# Patient Record
Sex: Male | Born: 1965 | Race: White | Hispanic: No | Marital: Single | Smoking: Current every day smoker
Health system: Southern US, Community
[De-identification: ages and names within clinical notes are randomized; demographics above are authoritative.]

## PROBLEM LIST (undated history)

## (undated) DIAGNOSIS — J449 Chronic obstructive pulmonary disease, unspecified: Secondary | ICD-10-CM

## (undated) DIAGNOSIS — S069X9A Unspecified intracranial injury with loss of consciousness of unspecified duration, initial encounter: Secondary | ICD-10-CM

## (undated) DIAGNOSIS — S069XAA Unspecified intracranial injury with loss of consciousness status unknown, initial encounter: Secondary | ICD-10-CM

## (undated) HISTORY — PX: ANKLE SURGERY: SHX546

## (undated) HISTORY — PX: CHOLECYSTECTOMY: SHX55

## (undated) HISTORY — DX: Unspecified intracranial injury with loss of consciousness of unspecified duration, initial encounter: S06.9X9A

## (undated) HISTORY — PX: OTHER SURGICAL HISTORY: SHX169

## (undated) HISTORY — DX: Unspecified intracranial injury with loss of consciousness status unknown, initial encounter: S06.9XAA

---

## 2005-12-05 ENCOUNTER — Emergency Department: Payer: Self-pay | Admitting: Emergency Medicine

## 2009-03-28 ENCOUNTER — Emergency Department: Payer: Self-pay | Admitting: Emergency Medicine

## 2009-03-30 ENCOUNTER — Ambulatory Visit: Payer: Self-pay | Admitting: Internal Medicine

## 2009-04-06 ENCOUNTER — Ambulatory Visit: Payer: Self-pay | Admitting: Internal Medicine

## 2009-04-13 ENCOUNTER — Ambulatory Visit: Payer: Self-pay | Admitting: Internal Medicine

## 2009-04-20 ENCOUNTER — Ambulatory Visit: Payer: Self-pay | Admitting: Internal Medicine

## 2009-04-27 ENCOUNTER — Ambulatory Visit: Payer: Self-pay | Admitting: Internal Medicine

## 2009-05-04 ENCOUNTER — Ambulatory Visit: Payer: Self-pay | Admitting: Internal Medicine

## 2009-05-11 ENCOUNTER — Ambulatory Visit: Payer: Self-pay | Admitting: Internal Medicine

## 2009-06-23 ENCOUNTER — Ambulatory Visit: Payer: Self-pay | Admitting: Family Medicine

## 2012-05-20 ENCOUNTER — Ambulatory Visit: Payer: Self-pay | Admitting: Orthopedic Surgery

## 2012-05-20 LAB — CBC
HCT: 49.5 % (ref 40.0–52.0)
HGB: 17.1 g/dL (ref 13.0–18.0)
MCH: 34 pg (ref 26.0–34.0)
MCHC: 34.6 g/dL (ref 32.0–36.0)
RBC: 5.03 10*6/uL (ref 4.40–5.90)

## 2012-05-20 LAB — BASIC METABOLIC PANEL
BUN: 13 mg/dL (ref 7–18)
Chloride: 105 mmol/L (ref 98–107)
Creatinine: 0.91 mg/dL (ref 0.60–1.30)
EGFR (Non-African Amer.): >60
Glucose: 115 mg/dL — ABNORMAL HIGH (ref 65–99)
Osmolality: 275 (ref 275–301)
Potassium: 3.9 mmol/L (ref 3.5–5.1)
Sodium: 137 mmol/L (ref 136–145)

## 2012-05-24 ENCOUNTER — Ambulatory Visit: Payer: Self-pay | Admitting: Orthopedic Surgery

## 2013-01-09 ENCOUNTER — Ambulatory Visit: Payer: Self-pay | Admitting: Pain Medicine

## 2013-04-18 ENCOUNTER — Emergency Department: Payer: Self-pay | Admitting: Emergency Medicine

## 2013-04-23 ENCOUNTER — Ambulatory Visit: Payer: Self-pay | Admitting: Pain Medicine

## 2013-05-20 ENCOUNTER — Ambulatory Visit: Payer: Self-pay | Admitting: Pain Medicine

## 2013-06-30 ENCOUNTER — Ambulatory Visit: Payer: Self-pay | Admitting: Pain Medicine

## 2013-09-30 ENCOUNTER — Ambulatory Visit: Payer: Self-pay | Admitting: Orthopedic Surgery

## 2014-10-27 NOTE — Op Note (Signed)
PATIENT NAME:  Kirk Martin, Kirk Martin MR#:  [id number] DATE OF BIRTH:  July 29, 1965  DATE OF PROCEDURE:  05/24/2012  PREOPERATIVE DIAGNOSIS: Left bimalleolar ankle fracture.   POSTOPERATIVE DIAGNOSIS: Left bimalleolar ankle fracture.   PROCEDURE: Open reduction internal fixation bimalleolar ankle fracture.   SURGEON: Timoteo Gaul, MD  ANESTHESIA: Spinal with local injection with 0.25% Marcaine plain.   COMPLICATIONS: None.   SPECIMENS: None.   INDICATION FOR THE PROCEDURE: Patient is a 49 year old male who fell off a ladder. The injury occurred on 05/11/2012 at work. Patient was found with a fractured dislocation on presentation to [facility] in [place]. He was closed reduced there and then was sent to my office by his workers compensation carrier. Given the instability of this injury and the patient's high level of functioning it was recommended he undergo open reduction internal fixation.   The risks and benefits of surgery were explained to the patient in the office prior to the date of surgery. The risks include infection, bleeding, nerve or blood vessel injury especially injury to the superficial peroneal nerve leading to permanent dorsal foot numbness, ankle stiffness or arthritis, malunion, nonunion, persistent pain, painful hardware requiring removal and the need for further surgery. Medical complications include, but are not limited to, deep vein thrombosis and pulmonary embolism, myocardial infarction, pneumonia, stroke, respiratory failure and death. Patient understood these risks and wished to proceed.   PROCEDURE NOTE: Patient was met in the preoperative area. His mother was at the bedside. I signed the left leg with the word "yes" according to the hospital's right site protocol. The patient had a splint on the left leg in the preop holding area. I also verbally confirmed with the patient that this was the correct site of surgery as well as used my office notes and the patient's  radiographic studies for reference.   Patient was then brought to the Operating Room where he underwent a spinal anesthetic by the anesthesia service. He was then placed supine on the operative table. A bump was placed under his left hip to allow for some internal rotation of the lower leg during the surgery. Patient was prepped and draped in sterile fashion. All bony prominences were adequately padded.   A timeout was performed to verify the patient's name, date of birth, medical record number, correct site of surgery, and correct procedure to be performed. It was also used to verify patient had received antibiotics and that all appropriate instruments, implants, and radiographic studies were available in the room. Once all in attendance were in agreement the case began.   Mini FluoroScan imaging was used to plan for the surgical incisions. The surgical incisions were based medially and laterally over the fracture site. A #15 blade was used to make a longitudinal lateral incision based over the fibula. The soft tissues were carefully dissected using a Metzenbaum scissor and pick-up. Care was taken to avoid injury to the superficial peroneal nerve. Once the fracture site was identified the wound was copiously irrigated. Fracture hematoma was removed. The fracture was then manually reduced and held into position with two K wires. A 10 hole one-third tubular plate was then contoured to the fibula and held into position with a fracture clamp. FluoroScan images were taken to ensure adequate reduction of the fracture and placement of the hardware. Two proximal bicortical screws and two distal cancellous screws were then placed. Again, images were taken to ensure the fracture remained reduced and the hardware was in good position. The remaining  proximal bicortical screws and two distal locking screws were then placed. The fracture was spanned with the hardware. The fracture was a transverse comminuted pattern and  did not lend itself to placement of a lag screw. Once the fibula was out to length and well fixed the wound was copiously irrigated. A damp Ray-Tec was placed in the wound.   The attention was then turned to the medial side. Again, FluoroScan imaging was used to help plan for the incision which was vertical over the medial malleolus. Again, the soft tissues were carefully dissected using a Metzenbaum scissor and pick-up. The fracture site was copiously irrigated. The joint was visualized through the medial malleolar fracture. There was a loose piece of chondral fragment which was removed from the joint. This chondral fragment appeared to be coming from the distal medial malleolar fragment. It was approximately 4 x 3 mm. The medial malleolar fracture was then replaced and held into position with a dental pick. K wires were then advanced from the tip of the medial malleolus across the fracture site and into the distal tibia. Three K wires in total were placed. These were then measured at 50 mm of depth. They were overdrilled and 4-0 cannulated screws x3 were advanced into the medial malleolus across the fracture and in the distal fibula. Once the screws were in position, the fracture was anatomically reduced. Again, both medial and lateral wounds are copiously irrigated. The subcutaneous tissues were closed with 2-0 Vicryl and the skin was approximated with skin staples on both sides. The ankle joint and both incisions were then injected with 0.25% Marcaine plain. Xeroform was placed over the incision and dry sterile dressings were applied along with an AO splint with the ankle in neutral position. Patient was then brought to the PAC-U in stable condition. I was scrubbed and present for the entire case and all sharp and instrument counts were correct at the conclusion of the case. Final FluoroScan images had been taken showing a symmetric mortise and no widening syndesmosis. Stress testing and a cotton test was  performed on the OR table prior to closure which showed no evidence of syndesmotic disruption. I spoke with the patient's mother in the postop consultation room to let her know the case had gone without complication and that the patient was stable in the recovery room.   ____________________________ Timoteo Gaul, MD klk:cms D: 05/24/2012 17:13:50 ET T: 05/25/2012 10:56:04 ET JOB#: [id number]  cc: Timoteo Gaul, MD, <Dictator> Timoteo Gaul MD ELECTRONICALLY SIGNED 05/27/2012 10:51

## 2014-10-31 NOTE — Op Note (Signed)
PATIENT NAME:  Kirk Martin, Kirk Martin MR#:  [id number] DATE OF BIRTH:  [birth year]  DATE OF PROCEDURE:  09/30/2013  PREOPERATIVE DIAGNOSIS: Left ankle painful hardware.   POSTOPERATIVE DIAGNOSIS:  Left ankle painful hardware.   PROCEDURE: Open removal of left ankle hardware.   ANESTHESIA: General with LMA.   SURGEON: Thornton Park, M.D.   FINDINGS DURING PROCEDURE: Healed lateral and medial malleolar fractures.   ESTIMATED BLOOD LOSS: Minimal.   TOURNIQUET TIME: 90 minutes.   COMPLICATIONS: None.   SPECIMENS: Plate and screws removed from left ankle.   INDICATIONS FOR PROCEDURE: Kirk Martin is a 49 year old male who has had persistent left ankle pain after his left ankle ORIF. The original surgery was performed on 05/24/2012. A recent CAT scan had been performed of the ankle showing that the fracture was healed. Given that he has had persistent tenderness over the medial and lateral malleoli, the decision was made to remove the hardware.   PROCEDURE NOTE: The patient was met in the preoperative area. I signed the left ankle with the word "yes" according to the hospital's right site protocol. His history and physical was performed at the bedside. The patient was then brought to the Operating Room where he was placed supine on the operative table. A tourniquet was applied to the left upper thigh. He underwent general anesthesia with an LMA. A timeout was performed to verify the patient's name, date of birth, medical record number, correct site of surgery and correct procedure to be performed. It was also used to verify that the patient had received antibiotics and that all appropriate instruments, implants and radiographic studies were available in the room. Once all in attendance were in agreement, the case began.   The patient had initial and FluoroScan images taken of the left ankle. The original incision was used. Once the proposed incisions were drawn out with a surgical marker, the left lower  extremity was Esmarched. Tourniquet was inflated to 275 mmHg. A lateral incision was then made and the subcutaneous tissue was dissected with electrocautery. The plate was easily identified and carefully dissected free from the overlying soft tissue. Care was taken to avoid injury to the superficial peroneal nerve. A locking screw driver and a screwdriver for 3.5 bicortical screws were used to remove all screws from the lateral plate. A Freer elevator was then used to elevate the plate off the fibula. The wound was copiously irrigated. A moist Ray-Tec was placed in the bed of the wound and the attention was turned to the medial side.   A vertical incision was made using original incision. Again, the soft tissues were dissected carefully using electrocautery. A 0.45 K wire was then used to identify the position of the underlying screws. The middle 4-0 cannulated screw was easily identified and easily removed using a cannulated screwdriver. The anterior and posterior screws were buried and seemed to have migrated proximally. A micro curette was used to remove any overlying bony obstruction to their removal. They were cannulated with four 0.045 C-wires. The cannulated screwdriver was then used for screw removal. All 4 screw holes were then filled with 1 mL of demineralized bone matrix.   The wounds were both again copiously irrigated. The subcutaneous tissue was closed with 2-0 Vicryl and the skin approximated with staples. A dry sterile dressing was applied along with an AO splint. The patient was then awakened and transferred to a hospital stretcher and brought to the PACU in stable condition. I was scrubbed and present for the  entire case, and all sharp and instrument counts were correct.   ____________________________ Timoteo Gaul, MD klk:cs D: 10/01/2013 13:49:25 ET T: 10/01/2013 14:49:31 ET JOB#: [id number]  cc: Timoteo Gaul, MD, <Dictator> Timoteo Gaul MD ELECTRONICALLY SIGNED  10/07/2013 11:23

## 2015-09-22 ENCOUNTER — Telehealth: Payer: Self-pay | Admitting: *Deleted

## 2015-09-22 NOTE — Telephone Encounter (Signed)
LM INFORMED THE PT THAT I WAS TRYING TO SCHEDULE HIM FOR AN APPT. I LEFT MY NAME AND NUMBER AND ASKED HIM TO RETURN MY CALL.Marland KitchenMarland KitchenTD

## 2015-10-12 ENCOUNTER — Ambulatory Visit: Payer: Self-pay | Admitting: Pain Medicine

## 2015-10-12 ENCOUNTER — Encounter: Payer: Self-pay | Admitting: Pain Medicine

## 2015-10-12 NOTE — Progress Notes (Unsigned)
1200 patient had to leave before physician could see him today due to family being involved in [place]. Patient states he will call and reschedule.

## 2015-10-12 NOTE — Progress Notes (Unsigned)
Safety precautions to be maintained throughout the outpatient stay will include: orient to surroundings, keep bed in low position, maintain call bell within reach at all times, provide assistance with transfer out of bed and ambulation.  Pt here would like opinion for options- wants to go back to work

## 2015-11-16 ENCOUNTER — Ambulatory Visit: Payer: Worker's Compensation | Attending: Pain Medicine | Admitting: Pain Medicine

## 2015-11-16 ENCOUNTER — Encounter: Payer: Self-pay | Admitting: Pain Medicine

## 2015-11-16 VITALS — BP 111/94 | HR 112 | Temp 97.7°F | Resp 16 | Ht 66.0 in | Wt 198.0 lb

## 2015-11-16 DIAGNOSIS — Z8781 Personal history of (healed) traumatic fracture: Secondary | ICD-10-CM | POA: Insufficient documentation

## 2015-11-16 DIAGNOSIS — M792 Neuralgia and neuritis, unspecified: Secondary | ICD-10-CM | POA: Diagnosis not present

## 2015-11-16 DIAGNOSIS — G90522 Complex regional pain syndrome I of left lower limb: Secondary | ICD-10-CM | POA: Diagnosis not present

## 2015-11-16 DIAGNOSIS — S82892A Other fracture of left lower leg, initial encounter for closed fracture: Secondary | ICD-10-CM | POA: Insufficient documentation

## 2015-11-16 DIAGNOSIS — Z9889 Other specified postprocedural states: Secondary | ICD-10-CM | POA: Diagnosis not present

## 2015-11-16 DIAGNOSIS — M79605 Pain in left leg: Secondary | ICD-10-CM | POA: Diagnosis present

## 2015-11-16 DIAGNOSIS — S82899A Other fracture of unspecified lower leg, initial encounter for closed fracture: Secondary | ICD-10-CM | POA: Insufficient documentation

## 2015-11-16 NOTE — Progress Notes (Signed)
Subjective:    Patient ID: Kirk Martin, male    DOB: 1966/02/06, 50 y.o.   MRN: [id number]  HPI  The patient is a 50 year old gentleman who comes to pain management center at the request of  Dr. Thornton Park for further evaluation for further evaluation and treatment of pain involving the left lower extremity. The patient is status post fracture of the left ankle when patient fell from lateral while on the job. The patient has had significantly limiting pain of the left lower extremity which patient described as shooting and hot sensation with pain occurring throughout the day as well as during the evening hours. The patient stated the pain increased with motion sitting and walking and is decreased with sleeping cold packs and lying down. The patient states that his pain continues despite to present treatment regimen and significantly interferes with all activities of daily living as well as ability to obtain restful sleep. We discussed patient's condition and will request permission to proceed with lumbar sympathetic block at time return appointment in attempt to decrease severity of symptoms, minimize progression of symptoms, and avoid the need for more involved treatment. The patient was with understanding and agreed to suggested treatment plan.  Review of Systems    The patient's review of systems was unremarkable according to patient's history  Cardiovascular: Unremarkable   Pulmonary: Unremarkable  Neurological: Unremarkable  Psychological: Unremarkable  Gastrointestinal: Unremarkable  Genitourinary: Unremarkable  Hematologic: Unremarkable  Endocrine: Unremarkable  Rheumatological: Unremarkable  Musculoskeletal: Unremarkable  Other significant: Unremarkable      Objective:   Physical Exam  There was tenderness to palpation of the splenius capitis and occipitalis musculature regions of minimal degree with minimal tenderness over the cervical facet  cervical paraspinal musculature region and minimal tenderness over the thoracic facet thoracic paraspinal musculature region. Palpation of the trapezius levator scapula and rhomboid musculature regions reveal patient to be with moderate discomfort. Palpation of the acromial clavicular and glenohumeral joint regions reproduce moderate discomfort. Tinel and Phalen's maneuver were without increase of pain of significant degree and patient appeared to be with unremarkable Spurling's maneuver. Palpation over the lumbar paraspinal must reason lumbar facet region was with moderate tenderness to palpation with tenderness over the PSIS and PII S region reproducing moderate discomfort. Straight leg raising was tolerates approximately 30. There was well-healed surgical scar of the left ankle with tenderness to palpation in the region of the ankle. There is of increased sensitivity to touch of the left lower extremity distal to the knee. EHL strength was decreased. There was negative Homans palpation of the greater trochanteric region iliotibial band region was with out significant increase of pain with mild tenderness to palpation along the iliotibial band region no definite sensory deficit or dermatomal distribution of the lower extremity noted. Abdomen was nontender with no costovertebral tenderness noted      Assessment & Plan:    Status post fracture of left ankle with open reduction and internal fixation of ankle  Complex regional pain syndrome of left lower extremity  Neuralgia      PLAN  Continue present medication  Lumbar sympathetic block to be performed at time return appointment  F/U PCP for evaliation of  BP and general medical  condition  F/U surgical evaluation. Patient will follow-up with Dr. Mack Guise as planned  F/U neurological evaluation. May consider pending follow-up evaluations  May consider radiofrequency rhizolysis or intraspinal procedures pending response to present  treatment and F/U evaluation   Patient to call  [facility] should patient have concerns prior to scheduled return appointment

## 2015-11-16 NOTE — Patient Instructions (Addendum)
PLAN  Continue present medication  Lumbar sympathetic block to be performed at time return appointment  F/U PCP for evaliation of  BP and general medical  condition  F/U surgical evaluation. Patient will follow-up with Dr. Mack Guise as planned  F/U neurological evaluation. May consider pending follow-up evaluations  May consider radiofrequency rhizolysis or intraspinal procedures pending response to present treatment and F/U evaluation   Patient to call [facility] should patient have concerns prior to scheduled return appointment. GENERAL RISKS AND COMPLICATIONS  What are the risk, side effects and possible complications? Generally speaking, most procedures are safe.  However, with any procedure there are risks, side effects, and the possibility of complications.  The risks and complications are dependent upon the sites that are lesioned, or the type of nerve block to be performed.  The closer the procedure is to the spine, the more serious the risks are.  Great care is taken when placing the radio frequency needles, block needles or lesioning probes, but sometimes complications can occur. 1. Infection: Any time there is an injection through the skin, there is a risk of infection.  This is why sterile conditions are used for these blocks.  There are four possible types of infection. 1. Localized skin infection. 2. Central Nervous System Infection-This can be in the form of Meningitis, which can be deadly. 3. Epidural Infections-This can be in the form of an epidural abscess, which can cause pressure inside of the spine, causing compression of the spinal cord with subsequent paralysis. This would require an emergency surgery to decompress, and there are no guarantees that the patient would recover from the paralysis. 4. Discitis-This is an infection of the intervertebral discs.  It occurs in about 1% of discography procedures.  It is difficult to treat and it may lead to surgery.        2. Pain: the needles have to go through skin and soft tissues, will cause soreness.       3. Damage to internal structures:  The nerves to be lesioned may be near blood vessels or    other nerves which can be potentially damaged.       4. Bleeding: Bleeding is more common if the patient is taking blood thinners such as  aspirin, Coumadin, Ticiid, Plavix, etc., or if he/she have some genetic predisposition  such as hemophilia. Bleeding into the spinal canal can cause compression of the spinal  cord with subsequent paralysis.  This would require an emergency surgery to  decompress and there are no guarantees that the patient would recover from the  paralysis.       5. Pneumothorax:  Puncturing of a lung is a possibility, every time a needle is introduced in  the area of the chest or upper back.  Pneumothorax refers to free air around the  collapsed lung(s), inside of the thoracic cavity (chest cavity).  Another two possible  complications related to a similar event would include: Hemothorax and Chylothorax.   These are variations of the Pneumothorax, where instead of air around the collapsed  lung(s), you may have blood or chyle, respectively.       6. Spinal headaches: They may occur with any procedures in the area of the spine.       7. Persistent CSF (Cerebro-Spinal Fluid) leakage: This is a rare problem, but may occur  with prolonged intrathecal or epidural catheters either due to the formation of a fistulous  track or a dural tear.  8. Nerve damage: By working so close to the spinal cord, there is always a possibility of  nerve damage, which could be as serious as a permanent spinal cord injury with  paralysis.       9. Death:  Although rare, severe deadly allergic reactions known as "Anaphylactic  reaction" can occur to any of the medications used.      10. Worsening of the symptoms:  We can always make thing worse.  What are the chances of something like this happening? Chances of any of  this occuring are extremely low.  By statistics, you have more of a chance of getting killed in a motor vehicle accident: while driving to the hospital than any of the above occurring .  Nevertheless, you should be aware that they are possibilities.  In general, it is similar to taking a shower.  Everybody knows that you can slip, hit your head and get killed.  Does that mean that you should not shower again?  Nevertheless always keep in mind that statistics do not mean anything if you happen to be on the wrong side of them.  Even if a procedure has a 1 (one) in a 1,000,000 (million) chance of going wrong, it you happen to be that one..Also, keep in mind that by statistics, you have more of a chance of having something go wrong when taking medications.  Who should not have this procedure? If you are on a blood thinning medication (e.g. Coumadin, Plavix, see list of "Blood Thinners"), or if you have an active infection going on, you should not have the procedure.  If you are taking any blood thinners, please inform your physician.  How should I prepare for this procedure?  Do not eat or drink anything at least six hours prior to the procedure.  Bring a driver with you .  It cannot be a taxi.  Come accompanied by an adult that can drive you back, and that is strong enough to help you if your legs get weak or numb from the local anesthetic.  Take all of your medicines the morning of the procedure with just enough water to swallow them.  If you have diabetes, make sure that you are scheduled to have your procedure done first thing in the morning, whenever possible.  If you have diabetes, take only half of your insulin dose and notify our nurse that you have done so as soon as you arrive at the clinic.  If you are diabetic, but only take blood sugar pills (oral hypoglycemic), then do not take them on the morning of your procedure.  You may take them after you have had the procedure.  Do not take  aspirin or any aspirin-containing medications, at least eleven (11) days prior to the procedure.  They may prolong bleeding.  Wear loose fitting clothing that may be easy to take off and that you would not mind if it got stained with Betadine or blood.  Do not wear any jewelry or perfume  Remove any nail coloring.  It will interfere with some of our monitoring equipment.  NOTE: Remember that this is not meant to be interpreted as a complete list of all possible complications.  Unforeseen problems may occur.  BLOOD THINNERS The following drugs contain aspirin or other products, which can cause increased bleeding during surgery and should not be taken for 2 weeks prior to and 1 week after surgery.  If you should need take something for relief of minor  pain, you may take acetaminophen which is found in Tylenol,m Datril, Anacin-3 and Panadol. It is not blood thinner. The products listed below are.  Do not take any of the products listed below in addition to any listed on your instruction sheet.  A.P.C or A.P.C with Codeine Codeine Phosphate Capsules #3 Ibuprofen Ridaura  ABC compound Congesprin Imuran rimadil  Advil Cope Indocin Robaxisal  Alka-Seltzer Effervescent Pain Reliever and Antacid Coricidin or Coricidin-D  Indomethacin Rufen  Alka-Seltzer plus Cold Medicine Cosprin Ketoprofen S-A-C Tablets  Anacin Analgesic Tablets or Capsules Coumadin Korlgesic Salflex  Anacin Extra Strength Analgesic tablets or capsules CP-2 Tablets Lanoril Salicylate  Anaprox Cuprimine Capsules Levenox Salocol  Anexsia-D Dalteparin Magan Salsalate  Anodynos Darvon compound Magnesium Salicylate Sine-off  Ansaid Dasin Capsules Magsal Sodium Salicylate  Anturane Depen Capsules Marnal Soma  APF Arthritis pain formula Dewitt's Pills Measurin Stanback  Argesic Dia-Gesic Meclofenamic Sulfinpyrazone  Arthritis Bayer Timed Release Aspirin Diclofenac Meclomen Sulindac  Arthritis pain formula Anacin Dicumarol Medipren  Supac  Analgesic (Safety coated) Arthralgen Diffunasal Mefanamic Suprofen  Arthritis Strength Bufferin Dihydrocodeine Mepro Compound Suprol  Arthropan liquid Dopirydamole Methcarbomol with Aspirin Synalgos  ASA tablets/Enseals Disalcid Micrainin Tagament  Ascriptin Doan's Midol Talwin  Ascriptin A/D Dolene Mobidin Tanderil  Ascriptin Extra Strength Dolobid Moblgesic Ticlid  Ascriptin with Codeine Doloprin or Doloprin with Codeine Momentum Tolectin  Asperbuf Duoprin Mono-gesic Trendar  Aspergum Duradyne Motrin or Motrin IB Triminicin  Aspirin plain, buffered or enteric coated Durasal Myochrisine Trigesic  Aspirin Suppositories Easprin Nalfon Trillsate  Aspirin with Codeine Ecotrin Regular or Extra Strength Naprosyn Uracel  Atromid-S Efficin Naproxen Ursinus  Auranofin Capsules Elmiron Neocylate Vanquish  Axotal Emagrin Norgesic Verin  Azathioprine Empirin or Empirin with Codeine Normiflo Vitamin E  Azolid Emprazil Nuprin Voltaren  Bayer Aspirin plain, buffered or children's or timed BC Tablets or powders Encaprin Orgaran Warfarin Sodium  Buff-a-Comp Enoxaparin Orudis Zorpin  Buff-a-Comp with Codeine Equegesic Os-Cal-Gesic   Buffaprin Excedrin plain, buffered or Extra Strength Oxalid   Bufferin Arthritis Strength Feldene Oxphenbutazone   Bufferin plain or Extra Strength Feldene Capsules Oxycodone with Aspirin   Bufferin with Codeine Fenoprofen Fenoprofen Pabalate or Pabalate-SF   Buffets II Flogesic Panagesic   Buffinol plain or Extra Strength Florinal or Florinal with Codeine Panwarfarin   Buf-Tabs Flurbiprofen Penicillamine   Butalbital Compound Four-way cold tablets Penicillin   Butazolidin Fragmin Pepto-Bismol   Carbenicillin Geminisyn Percodan   Carna Arthritis Reliever Geopen Persantine   Carprofen Gold's salt Persistin   Chloramphenicol Goody's Phenylbutazone   Chloromycetin Haltrain Piroxlcam   Clmetidine heparin Plaquenil   Cllnoril Hyco-pap Ponstel   Clofibrate Hydroxy  chloroquine Propoxyphen         Before stopping any of these medications, be sure to consult the physician who ordered them.  Some, such as Coumadin (Warfarin) are ordered to prevent or treat serious conditions such as "deep thrombosis", "pumonary embolisms", and other heart problems.  The amount of time that you may need off of the medication may also vary with the medication and the reason for which you were taking it.  If you are taking any of these medications, please make sure you notify your pain physician before you undergo any procedures.         Lumbar Sympathetic Block Patient Information  Description: The lumbar plexus is a group of nerves that are part of the sympathetic nervous system.  These nerves supply organs in the pelvis and legs.  Lumbar sympathetic blocks are utilized for the diagnosis and treatment  of painful conditions in these areas.   The lumbar plexus is located on both sides of the aorta at approximately the level of the second lumbar vertebral body.  The block will be performed with you lying on your abdomen with a pillow underneath.  Using direct x-ray guidance,   The plexus will be located on both sides of the spine.  Numbing medicine will be used to deaden the skin prior to needle insertion.  In most cases, a small amount of sedation can be give by IV prior to the numbing medicine.  One or two small needles will be placed near the plexus and local anesthetic will be injected.  This may make your leg(s) feel warm.  The Entire block usually lasts about 15-25 minutes.  Conditions which may be treated by lumbar sympathetic block:   Reflex sympathetic dystrophy  Phantom limb pain  Peripheral neuropathy  Peripheral vascular disease ( inadequate blood flow )  Cancer pain of pelvis, leg and kidney  Preparation for the injection:  1. Do note eat any solid food or diary products within 8 hours of your appointment. 2. You may drink clear liquids up to 3 hours  before appointment.  Clear liquids include water, black coffee, juice or soda.  No milk or cream please. 3. You may take your regular medication, including pain medications, with a sip of water before you appointment.  Diabetics should hold regular insulin ( if taken separately ) and take 1/2 NPH dose the morning of the procedure .  Carry some sugar containing items with you to your appointment. 4. A driver must accompany you and be prepared to drive you home after your procedure. 5. Bring all your current medication with you. 6. An IV may be inserted and sedation may be given at the discretion of the physician.  7. A blood pressure cuff, EKG and other monitors will often be applied during the procedure.  Some patients may need to have extra oxygen administered for a short period. 8. You will be asked to provide medical information, including your allergies and medications, prior to the procedure.  We must know immediately if your taking blood thinners (like Coumadin/Warfarin) or if you are allergic to IV iodine contrast (dye).  We must know if you could possibly be pregnant.  Possible side-effects   Bleeding from needle site or deeper  Infection (rare, can require surgery)  Nerve injury (rare)  Numbness & tingling (temporary)  Collapsed lung (rare)  Spinal headache (a headache worse with upright posture)  Light-headedness (temporary)  Pain at injection site (several days)  Decreased blood pressure (temporary)  Weakness in legs (temporary)  Seizure or other drug reaction (rare)  Call if you experience:   Fever/chills associated with headache or increased back/ neck pain  Headache worsened by an upright position  New onset weakness or numbness of an extremity below the injection site  Hives or difficulty breathing ( go to the emergency room)  Inflammation or drainage at the injections site(s)  New symptoms which are concerning to you  Please note:  If effective, we  will often do a series of 2-3 injections spaced 3-6 weeks apart to maximally decrease your pain.  If initial series is effective, you may be a candidate for a more permanent block of the lumbar sympathetic plexus.  If you have any questions please call [phone] [facility]

## 2015-11-16 NOTE — Progress Notes (Signed)
Safety precautions to be maintained throughout the outpatient stay will include: orient to surroundings, keep bed in low position, maintain call bell within reach at all times, provide assistance with transfer out of bed and ambulation.  

## 2015-11-24 LAB — TOXASSURE SELECT 13 (MW), URINE

## 2015-12-13 ENCOUNTER — Encounter: Payer: Self-pay | Admitting: Pain Medicine

## 2015-12-13 ENCOUNTER — Ambulatory Visit: Payer: Worker's Compensation | Attending: Pain Medicine | Admitting: Pain Medicine

## 2015-12-13 VITALS — BP 109/88 | HR 80 | Temp 96.4°F | Resp 16 | Ht 66.0 in | Wt 200.0 lb

## 2015-12-13 DIAGNOSIS — M79605 Pain in left leg: Secondary | ICD-10-CM | POA: Insufficient documentation

## 2015-12-13 DIAGNOSIS — Z8781 Personal history of (healed) traumatic fracture: Secondary | ICD-10-CM | POA: Insufficient documentation

## 2015-12-13 DIAGNOSIS — S82892A Other fracture of left lower leg, initial encounter for closed fracture: Secondary | ICD-10-CM

## 2015-12-13 DIAGNOSIS — M79604 Pain in right leg: Secondary | ICD-10-CM | POA: Diagnosis present

## 2015-12-13 DIAGNOSIS — G90522 Complex regional pain syndrome I of left lower limb: Secondary | ICD-10-CM

## 2015-12-13 MED ORDER — FENTANYL CITRATE (PF) 100 MCG/2ML IJ SOLN
100.0000 ug | Freq: Once | INTRAMUSCULAR | Status: AC
  Administered 2015-12-13: 100 ug via INTRAVENOUS
  Filled 2015-12-13: qty 2

## 2015-12-13 MED ORDER — CEFAZOLIN SODIUM 1-5 GM-% IV SOLN
1.0000 g | Freq: Once | INTRAVENOUS | Status: AC
  Administered 2015-12-13: 1 g via INTRAVENOUS

## 2015-12-13 MED ORDER — ORPHENADRINE CITRATE 30 MG/ML IJ SOLN
60.0000 mg | Freq: Once | INTRAMUSCULAR | Status: AC
  Administered 2015-12-13: 60 mg via INTRAMUSCULAR
  Filled 2015-12-13: qty 2

## 2015-12-13 MED ORDER — CEFAZOLIN SODIUM 1 G IJ SOLR
INTRAMUSCULAR | Status: AC
  Administered 2015-12-13: 01:00:00
  Filled 2015-12-13: qty 10

## 2015-12-13 MED ORDER — MIDAZOLAM HCL 5 MG/5ML IJ SOLN
5.0000 mg | Freq: Once | INTRAMUSCULAR | Status: AC
  Administered 2015-12-13: 3 mg via INTRAVENOUS
  Filled 2015-12-13: qty 5

## 2015-12-13 MED ORDER — CEFUROXIME AXETIL 250 MG PO TABS: 250.0000 mg | ORAL_TABLET | Freq: Two times a day (BID) | ORAL | Status: DC

## 2015-12-13 MED ORDER — BUPIVACAINE HCL (PF) 0.25 % IJ SOLN
30.0000 mL | Freq: Once | INTRAMUSCULAR | Status: AC
  Administered 2015-12-13: 30 mL
  Filled 2015-12-13: qty 30

## 2015-12-13 MED ORDER — TRIAMCINOLONE ACETONIDE 40 MG/ML IJ SUSP
INTRAMUSCULAR | Status: AC
  Administered 2015-12-13: 10:00:00
  Filled 2015-12-13: qty 1

## 2015-12-13 MED ORDER — TRIAMCINOLONE ACETONIDE 40 MG/ML IJ SUSP
40.0000 mg | Freq: Once | INTRAMUSCULAR | Status: AC
  Administered 2015-12-13: 40 mg

## 2015-12-13 MED ORDER — LACTATED RINGERS IV SOLN: 1000.0000 mL | INTRAVENOUS | Status: DC

## 2015-12-13 MED ORDER — LIDOCAINE HCL (PF) 1 % IJ SOLN
10.0000 mL | Freq: Once | INTRAMUSCULAR | Status: AC
Start: 2015-12-13 — End: 2015-12-13
  Administered 2015-12-13: 10 mL via SUBCUTANEOUS
  Filled 2015-12-13: qty 10

## 2015-12-13 NOTE — Progress Notes (Signed)
Patient here for procedure d/t Left ankle pain.   Safety precautions to be maintained throughout the outpatient stay will include: orient to surroundings, keep bed in low position, maintain call bell within reach at all times, provide assistance with transfer out of bed and ambulation.

## 2015-12-13 NOTE — Progress Notes (Signed)
Subjective:    Patient ID: Kirk Martin, male    DOB: 10-24-1965, 50 y.o.   MRN: [id number]  HPI  PROCEDURE PERFORMED: Lumbar sympathetic block.  HISTORY OF PRESENT ILLNESS: The patient is 50 y.o. male who returns to [facility] for further evaluation and treatment of pain involving the lower extremities. The patient is with history of Fracture of the left lower extremity with pain described as shooting burning stabbing pain . There is concern regarding the patient's pain being due to complex regional pain syndrome of the left lower extremity . The risks, benefits, and expectations of the procedure were discussed and explained to the patient who was understanding and wished to proceed with interventional treatment as planned.   DESCRIPTION OF PROCEDURE: Lumbar sympathetic block with IV Versed, IV fentanyl conscious sedation, EKG, blood pressure, pulse, capnography, and pulse oximetry monitoring. The procedure was performed with the patient in prone position under fluoroscopic guidance.   NEEDLE PLACEMENT AT L2, left side lumbar sympathetic block: With the patient in prone position and oblique orientation of 20 degrees, Betadine prep and local anesthetic skin wheal of 1.5% lidocaine plain was prepared at the proposed needle entry site. Under fluoroscopic guidance with 20 degrees oblique orientation, the 22 -gauge needle was inserted at the lateral border of the L2 vertebral body on the left side.   NEEDLE PLACEMENT AT L3, left side lumbar sympathetic block: With the patient in the prone position and oblique orientation of 20 degrees, Betadine prep and local anesthetic skin wheal of 1.5% lidocaine plain was prepared at the proposed needle entry site. Under fluoroscopic guidance with 20 degrees  oblique orientation, the 22 -gauge needle was inserted at the lateral border of the L3 vertebral body on the left side.   NEEDLE PLACEMENT AT L4, left side lumbar sympathetic block: With the  patient in prone position and oblique orientation of 20 degrees, Betadine prep and local anesthetic skin wheal of 1.5% lidocaine plain was prepared at the proposed needle entry site. Under fluoroscopic guidance with 20 degrees  oblique orientation, the 22 -gauge needle was inserted at the lateral border of the L4 vertebral body on the left side.    Following needle placement at the L2, L3 and L4 vertebral body levels on the left side, needle placement was then verified on lateral view with tip of the needle documented to be in the anterior third of the vertebral body of L2, L3 and L4 respectively. Following negative aspiration of each needle for heme and CSF, L2 vertebral body level needle was injected with 10 mL of 0.25% bupivacaine. L3 vertebral body level needle was injected with 10 mL of 0.25% bupivacaine. L4 vertebral body level was injected with 10 mL of 0.25% bupivacaine. Needles were removed. Please note temperature readings prior to lumbar sympathetic block were noted to be 85.9 degrees Fahrenheit and following completion of the lumbar sympathetic block temperature readings of the lower extremity were noted to be 91 degrees Fahrenheit. The patient tolerated the procedure well.  PLAN:   1. Medications: We will continue presently prescribed medications at this time. 2. The patient is to follow-up with primary care physician for further evaluation of blood pressure and general medical condition as discussed. 3. Surgical evaluation as discussed. 4. Neurological evaluation as discussed.. May consider additional neurological studies as discussed 5. The patient may be candidate for radiofrequency procedures, implantation devices, and other treatment pending response to treatment and follow-up evaluation. 6. The patient has been advised to adhere  to proper body mechanics. 7. The patient has been advised to call the Pain Management Center prior to scheduled return appointment should there be significant  change in condition or have other concerns regarding condition prior to scheduled return appointment.   The patient was understanding and in agreement with suggested treatment plan.     Review of Systems     Objective:   Physical Exam        Assessment & Plan:

## 2015-12-13 NOTE — Patient Instructions (Addendum)
PLAN  Continue present medications . Please obtain Ceftin antibiotic today and begin taking Ceftin antibiotic today as prescribed  F/U PCP for evaliation of  BP and general medical  condition  F/U surgical evaluation. Patient will follow-up with Dr. Mack Guise as planned  F/U neurological evaluation. May consider pending follow-up evaluations  May consider radiofrequency rhizolysis or intraspinal procedures pending response to present treatment and F/U evaluation   Patient to call Pain Management Center should patient have concerns prior to scheduled return appointment.Selective Nerve Root Block Patient Information  Description: Specific nerve roots exit the spinal canal and these nerves can be compressed and inflamed by a bulging disc and bone spurs.  By injecting steroids on the nerve root, we can potentially decrease the inflammation surrounding these nerves, which often leads to decreased pain.  Also, by injecting local anesthesia on the nerve root, this can provide [place] helpful information to give to your referring doctor if it decreases your pain.  Selective nerve root blocks can be done along the spine from the neck to the low back depending on the location of your pain.   After numbing the skin with local anesthesia, a small needle is passed to the nerve root and the position of the needle is verified using x-ray pictures.  After the needle is in correct position, we then deposit the medication.  You may experience a pressure sensation while this is being done.  The entire block usually lasts less than 15 minutes.  Conditions that may be treated with selective nerve root blocks:  Low back and leg pain  Spinal stenosis  Diagnostic block prior to potential surgery  Neck and arm pain  Post laminectomy syndrome  Preparation for the injection:  1. Do not eat any solid food or dairy products within 8 hours of your appointment. 2. You may drink clear liquids up to 3 hours before an  appointment.  Clear liquids include water, black coffee, juice or soda.  No milk or cream please. 3. You may take your regular medications, including pain medications, with a sip of water before your appointment.  Diabetics should hold regular insulin (if taken separately) and take 1/2 normal NPH dose the morning of the procedure.  Carry some sugar containing items with you to your appointment. 4. A driver must accompany you and be prepared to drive you home after your procedure. 5. Bring all your current medications with you. 6. An IV may be inserted and sedation may be given at the discretion of the physician. 7. A blood pressure cuff, EKG, and other monitors will often be applied during the procedure.  Some patients may need to have extra oxygen administered for a short period. 8. You will be asked to provide medical information, including allergies, prior to the procedure.  We must know immediately if you are taking blood  Thinners (like Coumadin) or if you are allergic to IV iodine contrast (dye).  Possible side-effects: All are usually temporary  Bleeding from needle site  Light headedness  Numbness and tingling  Decreased blood pressure  Weakness in arms/legs  Pressure sensation in back/neck  Pain at injection site (several days)  Possible complications: All are extremely rare  Infection  Nerve injury  Spinal headache (a headache wore with upright position)  Call if you experience:  Fever/chills associated with headache or increased back/neck pain  Headache worsened by an upright position  New onset weakness or numbness of an extremity below the injection site  Hives or difficulty breathing (  go to the emergency room)  Inflammation or drainage at the injection site(s)  Severe back/neck pain greater than usual  New symptoms which are concerning to you  Please note:  Although the local anesthetic injected can often make your back or neck feel good for  several hours after the injection the pain will likely return.  It takes 3-5 days for steroids to work on the nerve root. You may not notice any pain relief for at least one week.  If effective, we will often do a series of 3 injections spaced 3-6 weeks apart to maximally decrease your pain.    If you have any questions, please call [phone] [facility]  General Discharge Instructions :  If you need to reach your doctor call: Monday-Friday 8:00 am - 4:00 pm at [phone] or toll free [phone].  After clinic hours [phone] to have operator reach doctor.  Bring all of your medication bottles to all your appointments in the pain clinic.  To cancel or reschedule your appointment with Pain Management please remember to call 24 hours in advance to avoid a fee.  Refer to the educational materials which you have been given on: General Risks, I had my Procedure. Discharge Instructions, Post Sedation.  Post Procedure Instructions:  The drugs you were given will stay in your system until tomorrow, so for the next 24 hours you should not drive, make any legal decisions or drink any alcoholic beverages.  You may eat anything you prefer, but it is better to start with liquids then soups and crackers, and gradually work up to solid foods.  Please notify your doctor immediately if you have any unusual bleeding, trouble breathing or pain that is not related to your normal pain.  Depending on the type of procedure that was done, some parts of your body may feel week and/or numb.  This usually clears up by tonight or the next day.  Walk with the use of an assistive device or accompanied by an adult for the 24 hours.  You may use ice on the affected area for the first 24 hours.  Put ice in a Ziploc bag and cover with a towel and place against area 15 minutes on 15 minutes off.  You may switch to heat after 24 hours.

## 2015-12-13 NOTE — Progress Notes (Signed)
11:00  - Left Foot Temperature 91.0 F

## 2015-12-14 ENCOUNTER — Telehealth: Payer: Self-pay | Admitting: *Deleted

## 2015-12-14 ENCOUNTER — Telehealth: Payer: Self-pay

## 2015-12-14 NOTE — Telephone Encounter (Signed)
Called patient back, he had a question about the antibiotic that was prescribed, [facility] stated that d/t the fact this is a workmans comp case they will have to get the medication approved and it may take a day or two.  I did tell Kirk Martin that it was common practice when someone has metal in their body.  Explained that this was escribed yesterday at [phone], and hopefully they have worked on this and will have his medication for him.  That this was recommended by the physician and intended for him to take.  Patient verbalizes u/o information.

## 2015-12-14 NOTE — Telephone Encounter (Signed)
Left voicemail with patient to call our office if there are questions or concerns re; the procedure on yesterday.

## 2015-12-14 NOTE — Telephone Encounter (Signed)
Pt wants Cecille Rubin to call him back to talk about his procedure

## 2016-01-13 ENCOUNTER — Ambulatory Visit: Payer: Self-pay | Admitting: Pain Medicine

## 2016-01-14 ENCOUNTER — Ambulatory Visit: Payer: Self-pay | Admitting: Pain Medicine

## 2016-02-08 ENCOUNTER — Encounter: Payer: Self-pay | Admitting: Pain Medicine

## 2016-02-08 ENCOUNTER — Ambulatory Visit: Payer: Worker's Compensation | Attending: Pain Medicine | Admitting: Pain Medicine

## 2016-02-08 VITALS — BP 154/91 | HR 105 | Temp 98.3°F | Resp 16 | Ht 66.0 in | Wt 197.0 lb

## 2016-02-08 DIAGNOSIS — M79605 Pain in left leg: Secondary | ICD-10-CM | POA: Diagnosis present

## 2016-02-08 DIAGNOSIS — M792 Neuralgia and neuritis, unspecified: Secondary | ICD-10-CM | POA: Insufficient documentation

## 2016-02-08 DIAGNOSIS — Z9889 Other specified postprocedural states: Secondary | ICD-10-CM | POA: Insufficient documentation

## 2016-02-08 DIAGNOSIS — S82842G Displaced bimalleolar fracture of left lower leg, subsequent encounter for closed fracture with delayed healing: Secondary | ICD-10-CM

## 2016-02-08 DIAGNOSIS — Z8781 Personal history of (healed) traumatic fracture: Secondary | ICD-10-CM | POA: Diagnosis not present

## 2016-02-08 DIAGNOSIS — G90522 Complex regional pain syndrome I of left lower limb: Secondary | ICD-10-CM | POA: Diagnosis not present

## 2016-02-08 DIAGNOSIS — S82892A Other fracture of left lower leg, initial encounter for closed fracture: Secondary | ICD-10-CM

## 2016-02-08 DIAGNOSIS — S82891D Other fracture of right lower leg, subsequent encounter for closed fracture with routine healing: Secondary | ICD-10-CM

## 2016-02-08 NOTE — Patient Instructions (Addendum)
PLAN  Continue present medications  Lumbar synthetic block to be performed at time of return appointment  F/U PCP for evaluation of  BP and general medical  condition as we discussed today  F/U surgical evaluation. Patient will follow-up with Dr. Mack Guise as planned  F/U neurological evaluation. May consider pending follow-up evaluations  Repeat UDS today please  May consider radiofrequency rhizolysis or intraspinal procedures pending response to present treatment and F/U evaluation   Patient to call Pain Management Center should patient have concerns prior to scheduled return appointment.GENERAL RISKS AND COMPLICATIONS  What are the risk, side effects and possible complications? Generally speaking, most procedures are safe.  However, with any procedure there are risks, side effects, and the possibility of complications.  The risks and complications are dependent upon the sites that are lesioned, or the type of nerve block to be performed.  The closer the procedure is to the spine, the more serious the risks are.  Great care is taken when placing the radio frequency needles, block needles or lesioning probes, but sometimes complications can occur. 1. Infection: Any time there is an injection through the skin, there is a risk of infection.  This is why sterile conditions are used for these blocks.  There are four possible types of infection. 1. Localized skin infection. 2. Central Nervous System Infection-This can be in the form of Meningitis, which can be deadly. 3. Epidural Infections-This can be in the form of an epidural abscess, which can cause pressure inside of the spine, causing compression of the spinal cord with subsequent paralysis. This would require an emergency surgery to decompress, and there are no guarantees that the patient would recover from the paralysis. 4. Discitis-This is an infection of the intervertebral discs.  It occurs in about 1% of discography procedures.  It is  difficult to treat and it may lead to surgery.        2. Pain: the needles have to go through skin and soft tissues, will cause soreness.       3. Damage to internal structures:  The nerves to be lesioned may be near blood vessels or    other nerves which can be potentially damaged.       4. Bleeding: Bleeding is more common if the patient is taking blood thinners such as  aspirin, Coumadin, Ticiid, Plavix, etc., or if he/she have some genetic predisposition  such as hemophilia. Bleeding into the spinal canal can cause compression of the spinal  cord with subsequent paralysis.  This would require an emergency surgery to  decompress and there are no guarantees that the patient would recover from the  paralysis.       5. Pneumothorax:  Puncturing of a lung is a possibility, every time a needle is introduced in  the area of the chest or upper back.  Pneumothorax refers to free air around the  collapsed lung(s), inside of the thoracic cavity (chest cavity).  Another two possible  complications related to a similar event would include: Hemothorax and Chylothorax.   These are variations of the Pneumothorax, where instead of air around the collapsed  lung(s), you may have blood or chyle, respectively.       6. Spinal headaches: They may occur with any procedures in the area of the spine.       7. Persistent CSF (Cerebro-Spinal Fluid) leakage: This is a rare problem, but may occur  with prolonged intrathecal or epidural catheters either due to the formation of a  fistulous  track or a dural tear.       8. Nerve damage: By working so close to the spinal cord, there is always a possibility of  nerve damage, which could be as serious as a permanent spinal cord injury with  paralysis.       9. Death:  Although rare, severe deadly allergic reactions known as "Anaphylactic  reaction" can occur to any of the medications used.      10. Worsening of the symptoms:  We can always make thing worse.  What are the chances of  something like this happening? Chances of any of this occuring are extremely low.  By statistics, you have more of a chance of getting killed in a motor vehicle accident: while driving to the hospital than any of the above occurring .  Nevertheless, you should be aware that they are possibilities.  In general, it is similar to taking a shower.  Everybody knows that you can slip, hit your head and get killed.  Does that mean that you should not shower again?  Nevertheless always keep in mind that statistics do not mean anything if you happen to be on the wrong side of them.  Even if a procedure has a 1 (one) in a 1,000,000 (million) chance of going wrong, it you happen to be that one..Also, keep in mind that by statistics, you have more of a chance of having something go wrong when taking medications.  Who should not have this procedure? If you are on a blood thinning medication (e.g. Coumadin, Plavix, see list of "Blood Thinners"), or if you have an active infection going on, you should not have the procedure.  If you are taking any blood thinners, please inform your physician.  How should I prepare for this procedure?  Do not eat or drink anything at least six hours prior to the procedure.  Bring a driver with you .  It cannot be a taxi.  Come accompanied by an adult that can drive you back, and that is strong enough to help you if your legs get weak or numb from the local anesthetic.  Take all of your medicines the morning of the procedure with just enough water to swallow them.  If you have diabetes, make sure that you are scheduled to have your procedure done first thing in the morning, whenever possible.  If you have diabetes, take only half of your insulin dose and notify our nurse that you have done so as soon as you arrive at the clinic.  If you are diabetic, but only take blood sugar pills (oral hypoglycemic), then do not take them on the morning of your procedure.  You may take them  after you have had the procedure.  Do not take aspirin or any aspirin-containing medications, at least eleven (11) days prior to the procedure.  They may prolong bleeding.  Wear loose fitting clothing that may be easy to take off and that you would not mind if it got stained with Betadine or blood.  Do not wear any jewelry or perfume  Remove any nail coloring.  It will interfere with some of our monitoring equipment.  NOTE: Remember that this is not meant to be interpreted as a complete list of all possible complications.  Unforeseen problems may occur.  BLOOD THINNERS The following drugs contain aspirin or other products, which can cause increased bleeding during surgery and should not be taken for 2 weeks prior to and 1 week  after surgery.  If you should need take something for relief of minor pain, you may take acetaminophen which is found in Tylenol,m Datril, Anacin-3 and Panadol. It is not blood thinner. The products listed below are.  Do not take any of the products listed below in addition to any listed on your instruction sheet.  A.P.C or A.P.C with Codeine Codeine Phosphate Capsules #3 Ibuprofen Ridaura  ABC compound Congesprin Imuran rimadil  Advil Cope Indocin Robaxisal  Alka-Seltzer Effervescent Pain Reliever and Antacid Coricidin or Coricidin-D  Indomethacin Rufen  Alka-Seltzer plus Cold Medicine Cosprin Ketoprofen S-A-C Tablets  Anacin Analgesic Tablets or Capsules Coumadin Korlgesic Salflex  Anacin Extra Strength Analgesic tablets or capsules CP-2 Tablets Lanoril Salicylate  Anaprox Cuprimine Capsules Levenox Salocol  Anexsia-D Dalteparin Magan Salsalate  Anodynos Darvon compound Magnesium Salicylate Sine-off  Ansaid Dasin Capsules Magsal Sodium Salicylate  Anturane Depen Capsules Marnal Soma  APF Arthritis pain formula Dewitt's Pills Measurin Stanback  Argesic Dia-Gesic Meclofenamic Sulfinpyrazone  Arthritis Bayer Timed Release Aspirin Diclofenac Meclomen Sulindac   Arthritis pain formula Anacin Dicumarol Medipren Supac  Analgesic (Safety coated) Arthralgen Diffunasal Mefanamic Suprofen  Arthritis Strength Bufferin Dihydrocodeine Mepro Compound Suprol  Arthropan liquid Dopirydamole Methcarbomol with Aspirin Synalgos  ASA tablets/Enseals Disalcid Micrainin Tagament  Ascriptin Doan's Midol Talwin  Ascriptin A/D Dolene Mobidin Tanderil  Ascriptin Extra Strength Dolobid Moblgesic Ticlid  Ascriptin with Codeine Doloprin or Doloprin with Codeine Momentum Tolectin  Asperbuf Duoprin Mono-gesic Trendar  Aspergum Duradyne Motrin or Motrin IB Triminicin  Aspirin plain, buffered or enteric coated Durasal Myochrisine Trigesic  Aspirin Suppositories Easprin Nalfon Trillsate  Aspirin with Codeine Ecotrin Regular or Extra Strength Naprosyn Uracel  Atromid-S Efficin Naproxen Ursinus  Auranofin Capsules Elmiron Neocylate Vanquish  Axotal Emagrin Norgesic Verin  Azathioprine Empirin or Empirin with Codeine Normiflo Vitamin E  Azolid Emprazil Nuprin Voltaren  Bayer Aspirin plain, buffered or children's or timed BC Tablets or powders Encaprin Orgaran Warfarin Sodium  Buff-a-Comp Enoxaparin Orudis Zorpin  Buff-a-Comp with Codeine Equegesic Os-Cal-Gesic   Buffaprin Excedrin plain, buffered or Extra Strength Oxalid   Bufferin Arthritis Strength Feldene Oxphenbutazone   Bufferin plain or Extra Strength Feldene Capsules Oxycodone with Aspirin   Bufferin with Codeine Fenoprofen Fenoprofen Pabalate or Pabalate-SF   Buffets II Flogesic Panagesic   Buffinol plain or Extra Strength Florinal or Florinal with Codeine Panwarfarin   Buf-Tabs Flurbiprofen Penicillamine   Butalbital Compound Four-way cold tablets Penicillin   Butazolidin Fragmin Pepto-Bismol   Carbenicillin Geminisyn Percodan   Carna Arthritis Reliever Geopen Persantine   Carprofen Gold's salt Persistin   Chloramphenicol Goody's Phenylbutazone   Chloromycetin Haltrain Piroxlcam   Clmetidine heparin Plaquenil    Cllnoril Hyco-pap Ponstel   Clofibrate Hydroxy chloroquine Propoxyphen         Before stopping any of these medications, be sure to consult the physician who ordered them.  Some, such as Coumadin (Warfarin) are ordered to prevent or treat serious conditions such as "deep thrombosis", "pumonary embolisms", and other heart problems.  The amount of time that you may need off of the medication may also vary with the medication and the reason for which you were taking it.  If you are taking any of these medications, please make sure you notify your pain physician before you undergo any procedures.         Lumbar Sympathetic Block Patient Information  Description: The lumbar plexus is a group of nerves that are part of the sympathetic nervous system.  These nerves supply organs in the pelvis  and legs.  Lumbar sympathetic blocks are utilized for the diagnosis and treatment of painful conditions in these areas.   The lumbar plexus is located on both sides of the aorta at approximately the level of the second lumbar vertebral body.  The block will be performed with you lying on your abdomen with a pillow underneath.  Using direct x-ray guidance,   The plexus will be located on both sides of the spine.  Numbing medicine will be used to deaden the skin prior to needle insertion.  In most cases, a small amount of sedation can be give by IV prior to the numbing medicine.  One or two small needles will be placed near the plexus and local anesthetic will be injected.  This may make your leg(s) feel warm.  The Entire block usually lasts about 15-25 minutes.  Conditions which may be treated by lumbar sympathetic block:   Reflex sympathetic dystrophy  Phantom limb pain  Peripheral neuropathy  Peripheral vascular disease ( inadequate blood flow )  Cancer pain of pelvis, leg and kidney  Preparation for the injection:  1. Do note eat any solid food or diary products within 8 hours of your  appointment. 2. You may drink clear liquids up to 3 hours before appointment.  Clear liquids include water, black coffee, juice or soda.  No milk or cream please. 3. You may take your regular medication, including pain medications, with a sip of water before you appointment.  Diabetics should hold regular insulin ( if taken separately ) and take 1/2 NPH dose the morning of the procedure .  Carry some sugar containing items with you to your appointment. 4. A driver must accompany you and be prepared to drive you home after your procedure. 5. Bring all your current medication with you. 6. An IV may be inserted and sedation may be given at the discretion of the physician.  7. A blood pressure cuff, EKG and other monitors will often be applied during the procedure.  Some patients may need to have extra oxygen administered for a short period. 8. You will be asked to provide medical information, including your allergies and medications, prior to the procedure.  We must know immediately if your taking blood thinners (like Coumadin/Warfarin) or if you are allergic to IV iodine contrast (dye).  We must know if you could possibly be pregnant.  Possible side-effects   Bleeding from needle site or deeper  Infection (rare, can require surgery)  Nerve injury (rare)  Numbness & tingling (temporary)  Collapsed lung (rare)  Spinal headache (a headache worse with upright posture)  Light-headedness (temporary)  Pain at injection site (several days)  Decreased blood pressure (temporary)  Weakness in legs (temporary)  Seizure or other drug reaction (rare)  Call if you experience:   Fever/chills associated with headache or increased back/ neck pain  Headache worsened by an upright position  New onset weakness or numbness of an extremity below the injection site  Hives or difficulty breathing ( go to the emergency room)  Inflammation or drainage at the injections site(s)  New symptoms which  are concerning to you  Please note:  If effective, we will often do a series of 2-3 injections spaced 3-6 weeks apart to maximally decrease your pain.  If initial series is effective, you may be a candidate for a more permanent block of the lumbar sympathetic plexus.  If you have any questions please call [phone] [facility]

## 2016-02-08 NOTE — Progress Notes (Signed)
Safety precautions to be maintained throughout the outpatient stay will include: orient to surroundings, keep bed in low position, maintain call bell within reach at all times, provide assistance with transfer out of bed and ambulation.  

## 2016-02-08 NOTE — Progress Notes (Signed)
    The patient is a 50 year old gentleman who returns to pain management for further evaluation and treatment of pain involving the left lower extremity status post fracture of the left lower extremity. The patient stated that he had significant improvement of his pain following previous procedure performed in pain management consisting of lumbar sympathetic block of the left lower extremity. We discussed patient's condition and we will request approval for lumbar sympathetic block to be performed  again for the left lower extremity pain and paresthesias. The patient stated that he was able to move more freely without pain and that he was able to perform chores more expeditiously as well as other activities more quickly and without pain following his procedure. We will request approval for lumbar synthetic block to be performed at time return appointment.. All agreed to suggested treatment plan     Physical examination  There was tenderness of the splenius capitis and occipitalis region of mild degree with mild tenderness of the cervical facet cervical paraspinal musculature region. Palpation of the acromioclavicular and glenohumeral joint regions reproduce mild discomfort as well. The patient was able to perform drop test with mild difficulty. The patient appeared to be with bilaterally equal grip strength without significant increase of pain with Tinel and Phalen's maneuver. Palpation over the region of the lumbar region was attends to palpation of moderate degree with lateral bending rotation extension and palpation over the lumbar facets reproducing moderate discomfort. There was moderate tenderness of the PSIS and PII S regions. Straight leg raising was tolerates approximately 20 there was decreased EHL strength of the left lower extremity. No definite sensory deficit of dermatomal distribution was detected. There appeared to be increased sensitivity to touch of the lower extremity. There was  questionably decreased temperature of the left lower extremity compared to the right lower extremity. There was well-healed surgical scar of the left lower extremity noted there was tenderness to palpation in the region of the scar without increased warmth or erythema The knee was with tenderness to palpation with negative anterior and posterior drawer signs without ballottement of the patella. No new lesions of the skin were noted. There was negative Homans. Abdomen was nontender with no costovertebral tenderness noted     Assessment   Status post fracture of left ankle with open reduction and internal fixation of ankle  Complex regional pain syndrome of left lower extremity  Neuralgia      PLAN  Continue present medications  Lumbar synthetic block to be performed at time of return appointment  F/U PCP for evaluation of  BP and general medical  condition as we discussed today  F/U surgical evaluation. Patient will follow-up with Dr. Mack Guise as planned  F/U neurological evaluation. May consider pending follow-up evaluations  Repeat UDS today please  May consider radiofrequency rhizolysis or intraspinal procedures pending response to present treatment and F/U evaluation   Patient to call Pain Management Center should patient have concerns prior to scheduled return appointment

## 2016-02-17 LAB — TOXASSURE SELECT 13 (MW), URINE: PDF: 0

## 2016-02-17 NOTE — Progress Notes (Signed)
Reviewed

## 2019-01-13 ENCOUNTER — Telehealth: Payer: Self-pay

## 2019-01-13 DIAGNOSIS — Z20822 Contact with and (suspected) exposure to covid-19: Secondary | ICD-10-CM

## 2019-01-13 NOTE — Telephone Encounter (Signed)
Call from [place] [facility] [employer].  Referring Pat. For covid-19 testing.  Pt.  Scheduled for 01/14/19 @ 1015 am

## 2019-01-14 ENCOUNTER — Other Ambulatory Visit: Payer: Self-pay

## 2019-01-14 DIAGNOSIS — Z20822 Contact with and (suspected) exposure to covid-19: Secondary | ICD-10-CM

## 2019-01-19 LAB — NOVEL CORONAVIRUS, NAA: SARS-CoV-2, NAA: NOT DETECTED

## 2019-03-25 ENCOUNTER — Other Ambulatory Visit: Payer: Self-pay

## 2019-03-25 ENCOUNTER — Ambulatory Visit: Payer: Medicaid Other | Admitting: Family Medicine

## 2019-06-17 ENCOUNTER — Other Ambulatory Visit: Payer: Self-pay

## 2019-06-17 ENCOUNTER — Encounter: Payer: Self-pay | Admitting: Family Medicine

## 2019-06-17 ENCOUNTER — Ambulatory Visit
Admission: RE | Admit: 2019-06-17 | Discharge: 2019-06-17 | Disposition: A | Discharge status: Home / Self Care | Payer: Medicaid Other | Source: Ambulatory Visit | Attending: Family Medicine | Admitting: Family Medicine

## 2019-06-17 ENCOUNTER — Ambulatory Visit: Payer: Medicaid Other | Admitting: Family Medicine

## 2019-06-17 VITALS — BP 116/78 | HR 100 | Temp 98.3°F | Resp 14 | Ht 66.0 in | Wt 198.8 lb

## 2019-06-17 DIAGNOSIS — J411 Mucopurulent chronic bronchitis: Secondary | ICD-10-CM

## 2019-06-17 DIAGNOSIS — Z1211 Encounter for screening for malignant neoplasm of colon: Secondary | ICD-10-CM

## 2019-06-17 DIAGNOSIS — R05 Cough: Secondary | ICD-10-CM | POA: Diagnosis not present

## 2019-06-17 DIAGNOSIS — R0600 Dyspnea, unspecified: Secondary | ICD-10-CM | POA: Diagnosis not present

## 2019-06-17 DIAGNOSIS — F17200 Nicotine dependence, unspecified, uncomplicated: Secondary | ICD-10-CM | POA: Diagnosis not present

## 2019-06-17 DIAGNOSIS — R0609 Other forms of dyspnea: Secondary | ICD-10-CM

## 2019-06-17 DIAGNOSIS — Z7689 Persons encountering health services in other specified circumstances: Secondary | ICD-10-CM
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
## 2019-06-18 ENCOUNTER — Telehealth: Payer: Self-pay | Admitting: Gastroenterology

## 2019-06-18 ENCOUNTER — Telehealth: Payer: Self-pay

## 2019-06-18 ENCOUNTER — Other Ambulatory Visit: Payer: Self-pay

## 2019-06-18 DIAGNOSIS — Z1211 Encounter for screening for malignant neoplasm of colon: Secondary | ICD-10-CM

## 2019-06-18 NOTE — Telephone Encounter (Signed)
Gastroenterology Pre-Procedure Review  Request Date: Wed 07/15/18 Requesting Physician: Dr. Vicente Males  PATIENT REVIEW QUESTIONS: The patient responded to the following health history questions as indicated:    1. Are you having any GI issues? no 2. Do you have a personal history of Polyps? no 3. Do you have a family history of Colon Cancer or Polyps? unsure possibly grandther had colon polyps or colon cancer 4. Diabetes Mellitus? no 5. Joint replacements in the past 12 months?no 6. Major health problems in the past 3 months?no 7. Any artificial heart valves, MVP, or defibrillator?no    MEDICATIONS & ALLERGIES:    Patient reports the following regarding taking any anticoagulation/antiplatelet therapy:   Plavix, Coumadin, Eliquis, Xarelto, Lovenox, Pradaxa, Brilinta, or Effient? no Aspirin? no  Patient confirms/reports the following medications:  Current Outpatient Medications  Medication Sig Dispense Refill  . naproxen sodium (ANAPROX) 220 MG tablet Take 660 mg by mouth as needed.     Current Facility-Administered Medications  Medication Dose Route Frequency Provider Last Rate Last Dose  . lactated ringers infusion 1,000 mL  1,000 mL Intravenous Continuous Mohammed Kindle, MD        Patient confirms/reports the following allergies:  No Known Allergies  No orders of the defined types were placed in this encounter.   AUTHORIZATION INFORMATION Primary Insurance: 1D#: Group #:  Secondary Insurance: 1D#: Group #:  SCHEDULE INFORMATION: Date: Wed 07/16/19 Time: Location:[facility]

## 2019-06-18 NOTE — Telephone Encounter (Signed)
Patient returning call from [place] to schedule a colonoscopy.

## 2019-06-26 DIAGNOSIS — J411 Mucopurulent chronic bronchitis: Secondary | ICD-10-CM | POA: Insufficient documentation

## 2019-06-26 DIAGNOSIS — R0609 Other forms of dyspnea: Secondary | ICD-10-CM | POA: Insufficient documentation

## 2019-06-26 DIAGNOSIS — F17200 Nicotine dependence, unspecified, uncomplicated: Secondary | ICD-10-CM | POA: Insufficient documentation

## 2019-07-14 ENCOUNTER — Telehealth: Payer: Self-pay | Admitting: Gastroenterology

## 2019-07-14 ENCOUNTER — Other Ambulatory Visit
Admission: RE | Admit: 2019-07-14 | Discharge: 2019-07-14 | Disposition: A | Discharge status: Home / Self Care | Payer: Medicaid Other | Source: Ambulatory Visit | Attending: Gastroenterology | Admitting: Gastroenterology

## 2019-07-14 ENCOUNTER — Telehealth: Payer: Self-pay | Admitting: Family Medicine

## 2019-07-14 DIAGNOSIS — Z20822 Contact with and (suspected) exposure to covid-19: Secondary | ICD-10-CM | POA: Diagnosis not present

## 2019-07-14 DIAGNOSIS — Z01812 Encounter for preprocedural laboratory examination: Secondary | ICD-10-CM | POA: Insufficient documentation

## 2019-07-14 DIAGNOSIS — R0609 Other forms of dyspnea: Secondary | ICD-10-CM

## 2019-07-14 DIAGNOSIS — J411 Mucopurulent chronic bronchitis: Secondary | ICD-10-CM

## 2019-07-14 LAB — SARS CORONAVIRUS 2 (TAT 6-24 HRS): SARS Coronavirus 2: NEGATIVE

## 2019-07-14 NOTE — Telephone Encounter (Signed)
Pt has been contacted to apologize for the inconvenience caused in COVID test date incorrectly provided.  Patient was pleasant and understanding.  He stated that he will go for COVID test today.  I contacted PAT LVM to let them know he would be coming today.  His colonoscopy is on Wednesday.  Thanks [employer]

## 2019-07-14 NOTE — Telephone Encounter (Signed)
Pt left vm he states he has been waiting outside for his Covid test 45 minutes he states no one is there please call pt

## 2019-07-14 NOTE — Telephone Encounter (Signed)
Pt said that lEISA was to call in an inhaler for him and you asked me to send you a message to get with her.

## 2019-07-15 MED ORDER — BUDESONIDE-FORMOTEROL FUMARATE 80-4.5 MCG/ACT IN AERO: 2.0000 | INHALATION_SPRAY | Freq: Two times a day (BID) | RESPIRATORY_TRACT | 3 refills | Status: DC

## 2019-07-15 MED ORDER — ALBUTEROL SULFATE 108 (90 BASE) MCG/ACT IN AEPB: 2.0000 | INHALATION_SPRAY | RESPIRATORY_TRACT | 1 refills | Status: DC | PRN

## 2019-07-16 ENCOUNTER — Ambulatory Visit: Payer: Medicaid Other | Admitting: Registered Nurse

## 2019-07-16 ENCOUNTER — Ambulatory Visit
Admission: RE | Admit: 2019-07-16 | Discharge: 2019-07-16 | Disposition: A | Discharge status: Home / Self Care | Payer: Medicaid Other | Attending: Gastroenterology | Admitting: Gastroenterology

## 2019-07-16 ENCOUNTER — Other Ambulatory Visit: Payer: Self-pay

## 2019-07-16 ENCOUNTER — Encounter
Admission: RE | Disposition: A | Discharge status: Home / Self Care | Payer: Self-pay | Source: Home / Self Care | Attending: Gastroenterology

## 2019-07-16 ENCOUNTER — Encounter: Payer: Self-pay | Admitting: Gastroenterology

## 2019-07-16 DIAGNOSIS — Z87820 Personal history of traumatic brain injury: Secondary | ICD-10-CM | POA: Diagnosis not present

## 2019-07-16 DIAGNOSIS — K5730 Diverticulosis of large intestine without perforation or abscess without bleeding: Secondary | ICD-10-CM | POA: Insufficient documentation

## 2019-07-16 DIAGNOSIS — J449 Chronic obstructive pulmonary disease, unspecified: Secondary | ICD-10-CM | POA: Insufficient documentation

## 2019-07-16 DIAGNOSIS — F17210 Nicotine dependence, cigarettes, uncomplicated: Secondary | ICD-10-CM | POA: Diagnosis not present

## 2019-07-16 DIAGNOSIS — Z1211 Encounter for screening for malignant neoplasm of colon: Secondary | ICD-10-CM | POA: Diagnosis not present

## 2019-07-16 DIAGNOSIS — K635 Polyp of colon: Secondary | ICD-10-CM

## 2019-07-16 DIAGNOSIS — D123 Benign neoplasm of transverse colon: Secondary | ICD-10-CM | POA: Insufficient documentation

## 2019-07-16 DIAGNOSIS — K5790 Diverticulosis of intestine, part unspecified, without perforation or abscess without bleeding: Secondary | ICD-10-CM | POA: Diagnosis not present

## 2019-07-16 HISTORY — DX: Chronic obstructive pulmonary disease, unspecified: J44.9

## 2019-07-16 HISTORY — PX: COLONOSCOPY: SHX5424

## 2019-07-16 SURGERY — COLONOSCOPY
Anesthesia: General

## 2019-07-16 MED ORDER — LIDOCAINE HCL (PF) 2 % IJ SOLN
INTRAMUSCULAR | Status: AC
  Filled 2019-07-16: qty 10

## 2019-07-16 MED ORDER — PROPOFOL 10 MG/ML IV BOLUS
INTRAVENOUS | Status: DC | PRN
  Administered 2019-07-16: 90 mg via INTRAVENOUS

## 2019-07-16 MED ORDER — LIDOCAINE HCL (CARDIAC) PF 100 MG/5ML IV SOSY
PREFILLED_SYRINGE | INTRAVENOUS | Status: DC | PRN
  Administered 2019-07-16: 40 mg via INTRAVENOUS

## 2019-07-16 MED ORDER — SODIUM CHLORIDE 0.9 % IV SOLN: INTRAVENOUS | Status: DC

## 2019-07-16 MED ORDER — PHENYLEPHRINE HCL (PRESSORS) 10 MG/ML IV SOLN
INTRAVENOUS | Status: DC | PRN
  Administered 2019-07-16: 200 ug via INTRAVENOUS

## 2019-07-16 MED ORDER — PROPOFOL 500 MG/50ML IV EMUL
INTRAVENOUS | Status: DC | PRN
  Administered 2019-07-16: 150 ug/kg/min via INTRAVENOUS

## 2019-07-16 MED ORDER — PROPOFOL 500 MG/50ML IV EMUL
INTRAVENOUS | Status: AC
  Filled 2019-07-16: qty 50

## 2019-07-16 NOTE — Transfer of Care (Signed)
Immediate Anesthesia Transfer of Care Note  Patient: Kirk Martin  Procedure(s) Performed: Procedure(s): COLONOSCOPY (N/A)  Patient Location: PACU and Endoscopy Unit  Anesthesia Type:General  Level of Consciousness: sedated  Airway & Oxygen Therapy: Patient Spontanous Breathing and Patient connected to nasal cannula oxygen  Post-op Assessment: Report given to RN and Post -op Vital signs reviewed and stable  Post vital signs: Reviewed and stable  Last Vitals:  Vitals:   07/16/19 1301 07/16/19 1351  BP: (!) 133/100 (!) 77/52  Pulse: (!) 108 78  Resp: 17 20  Temp: (!) 36.1 C (!) 36 C  SpO2: 123456 0000000    Complications: No apparent anesthesia complications

## 2019-07-16 NOTE — Anesthesia Postprocedure Evaluation (Signed)
Anesthesia Post Note  Patient: Kirk Martin  Procedure(s) Performed: COLONOSCOPY (N/A )  Patient location during evaluation: PACU Anesthesia Type: General Level of consciousness: awake and alert Pain management: pain level controlled Vital Signs Assessment: post-procedure vital signs reviewed and stable Respiratory status: spontaneous breathing, nonlabored ventilation, respiratory function stable and patient connected to nasal cannula oxygen Cardiovascular status: blood pressure returned to baseline and stable Postop Assessment: no apparent nausea or vomiting Anesthetic complications: no     Last Vitals:  Vitals:   07/16/19 1301 07/16/19 1351  BP: (!) 133/100 (!) 77/52  Pulse: (!) 108 78  Resp: 17 20  Temp: (!) 36.1 C (!) 36 C  SpO2: 99% 96%    Last Pain:  Vitals:   07/16/19 1351  TempSrc: Temporal  PainSc: Asleep                 Arita Miss

## 2019-07-16 NOTE — Op Note (Signed)
[facility] Gastroenterology Patient Name: Kirk Martin Procedure Date: 07/16/2019 1:27 PM MRN: [id number] Account #: [id number] Date of Birth: [birth year] Admit Type: Outpatient Age: 54 Room: [facility] ENDO ROOM 1 Gender: Male Note Status: Finalized Procedure:             Colonoscopy Indications:           Screening for colorectal malignant neoplasm Providers:             Jonathon Bellows MD, MD Referring MD:          No Local Md, MD (Referring MD) Medicines:             Monitored Anesthesia Care Complications:         No immediate complications. Procedure:             Pre-Anesthesia Assessment:                        - Prior to the procedure, a History and Physical was                         performed, and patient medications, allergies and                         sensitivities were reviewed. The patient's tolerance                         of previous anesthesia was reviewed.                        - The risks and benefits of the procedure and the                         sedation options and risks were discussed with the                         patient. All questions were answered and informed                         consent was obtained.                        - ASA Grade Assessment: II - A patient with mild                         systemic disease.                        After obtaining informed consent, the colonoscope was                         passed under direct vision. Throughout the procedure,                         the patient's blood pressure, pulse, and oxygen                         saturations were monitored continuously. The                         Colonoscope was introduced through the anus  and                         advanced to the the cecum, identified by the                         appendiceal orifice. The colonoscopy was performed                         with ease. The patient tolerated the procedure well.                         The quality of the  bowel preparation was good. Findings:      The perianal and digital rectal examinations were normal.      Multiple small-mouthed diverticula were found in the sigmoid colon.      A 6 mm polyp was found in the transverse colon. The polyp was sessile.       The polyp was removed with a cold snare. Resection and retrieval were       complete.      The exam was otherwise without abnormality on direct and retroflexion       views. Impression:            - Diverticulosis in the sigmoid colon.                        - One 6 mm polyp in the transverse colon, removed with                         a cold snare. Resected and retrieved.                        - The examination was otherwise normal on direct and                         retroflexion views. Recommendation:        - Discharge patient to home (with escort).                        - Resume previous diet.                        - Continue present medications.                        - Await pathology results.                        - Repeat colonoscopy for surveillance based on                         pathology results. Procedure Code(s):     --- Professional ---                        [phone], Colonoscopy, flexible; with removal of                         tumor(s), polyp(s), or other lesion(s) by snare  technique Diagnosis Code(s):     --- Professional ---                        Z12.11, Encounter for screening for malignant neoplasm                         of colon                        K63.5, Polyp of colon                        K57.30, Diverticulosis of large intestine without                         perforation or abscess without bleeding CPT copyright 2019 American Medical Association. All rights reserved. The codes documented in this report are preliminary and upon coder review may  be revised to meet current compliance requirements. Jonathon Bellows, MD Jonathon Bellows MD, MD 07/16/2019 1:46:46 PM This report has been  signed electronically. Number of Addenda: 0 Note Initiated On: 07/16/2019 1:27 PM Scope Withdrawal Time: 0 hours 13 minutes 42 seconds  Total Procedure Duration: 0 hours 15 minutes 19 seconds  Estimated Blood Loss:  Estimated blood loss: none.      [facility]
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
## 2019-07-16 NOTE — Anesthesia Procedure Notes (Signed)
Date/Time: 07/16/2019 1:26 PM Performed by: Doreen Salvage, CRNA Pre-anesthesia Checklist: Patient identified, Emergency Drugs available, Suction available and Patient being monitored Patient Re-evaluated:Patient Re-evaluated prior to induction Oxygen Delivery Method: Nasal cannula Induction Type: IV induction Dental Injury: Teeth and Oropharynx as per pre-operative assessment  Comments: Nasal cannula with etCO2 monitoring
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
## 2019-07-17 LAB — SURGICAL PATHOLOGY

## 2019-07-18 ENCOUNTER — Encounter: Payer: Self-pay | Admitting: Gastroenterology

## 2019-07-29 ENCOUNTER — Encounter: Payer: Self-pay | Admitting: Family Medicine

## 2019-07-29 ENCOUNTER — Ambulatory Visit: Payer: Medicaid Other | Admitting: Family Medicine

## 2019-07-29 ENCOUNTER — Other Ambulatory Visit: Payer: Self-pay

## 2019-07-29 VITALS — BP 132/84 | HR 102 | Temp 97.9°F | Resp 16 | Ht 66.0 in | Wt 188.8 lb

## 2019-07-29 DIAGNOSIS — J449 Chronic obstructive pulmonary disease, unspecified: Secondary | ICD-10-CM

## 2019-07-29 DIAGNOSIS — Z13228 Encounter for screening for other metabolic disorders: Secondary | ICD-10-CM

## 2019-07-29 DIAGNOSIS — J310 Chronic rhinitis: Secondary | ICD-10-CM

## 2019-07-29 DIAGNOSIS — F17200 Nicotine dependence, unspecified, uncomplicated: Secondary | ICD-10-CM

## 2019-07-29 DIAGNOSIS — Z1329 Encounter for screening for other suspected endocrine disorder: Secondary | ICD-10-CM | POA: Diagnosis not present

## 2019-07-29 DIAGNOSIS — Z0000 Encounter for general adult medical examination without abnormal findings: Secondary | ICD-10-CM | POA: Diagnosis not present

## 2019-07-29 DIAGNOSIS — J309 Allergic rhinitis, unspecified: Secondary | ICD-10-CM | POA: Insufficient documentation

## 2019-07-29 DIAGNOSIS — F17210 Nicotine dependence, cigarettes, uncomplicated: Secondary | ICD-10-CM

## 2019-07-29 DIAGNOSIS — Z130 Encounter for screening for diseases of the blood and blood-forming organs and certain disorders involving the immune mechanism: Secondary | ICD-10-CM

## 2019-07-29 LAB — CBC WITH DIFFERENTIAL/PLATELET
Absolute Monocytes: 493 cells/uL (ref 200–950)
Basophils Absolute: 23 cells/uL (ref 0–200)
Basophils Relative: 0.3 %
Eosinophils Absolute: 69 cells/uL (ref 15–500)
Eosinophils Relative: 0.9 %
HCT: 52.6 % — ABNORMAL HIGH (ref 38.5–50.0)
Hemoglobin: 18.3 g/dL — ABNORMAL HIGH (ref 13.2–17.1)
Lymphs Abs: 3103 cells/uL (ref 850–3900)
MCH: 33.2 pg — ABNORMAL HIGH (ref 27.0–33.0)
MCHC: 34.8 g/dL (ref 32.0–36.0)
MCV: 95.3 fL (ref 80.0–100.0)
MPV: 8.5 fL (ref 7.5–12.5)
Monocytes Relative: 6.4 %
Neutro Abs: 4012 cells/uL (ref 1500–7800)
Neutrophils Relative %: 52.1 %
Platelets: 342 10*3/uL (ref 140–400)
RBC: 5.52 10*6/uL (ref 4.20–5.80)
RDW: 13.2 % (ref 11.0–15.0)
Total Lymphocyte: 40.3 %
WBC: 7.7 10*3/uL (ref 3.8–10.8)

## 2019-07-29 LAB — COMPLETE METABOLIC PANEL WITH GFR
AG Ratio: 1.5 (calc) (ref 1.0–2.5)
ALT: 24 U/L (ref 9–46)
AST: 24 U/L (ref 10–35)
Albumin: 4.4 g/dL (ref 3.6–5.1)
Alkaline phosphatase (APISO): 75 U/L (ref 35–144)
BUN: 7 mg/dL (ref 7–25)
CO2: 27 mmol/L (ref 20–32)
Calcium: 9.9 mg/dL (ref 8.6–10.3)
Chloride: 101 mmol/L (ref 98–110)
Creat: 1 mg/dL (ref 0.70–1.33)
GFR, Est African American: 99 mL/min/{1.73_m2} (ref >=60)
GFR, Est Non African American: 86 mL/min/{1.73_m2} (ref >=60)
Globulin: 2.9 g/dL (calc) (ref 1.9–3.7)
Glucose, Bld: 99 mg/dL (ref 65–99)
Potassium: 4.4 mmol/L (ref 3.5–5.3)
Sodium: 137 mmol/L (ref 135–146)
Total Bilirubin: 0.5 mg/dL (ref 0.2–1.2)
Total Protein: 7.3 g/dL (ref 6.1–8.1)

## 2019-07-29 LAB — LIPID PANEL
Cholesterol: 219 mg/dL — ABNORMAL HIGH (ref <200)
HDL: 40 mg/dL (ref >=40)
LDL Cholesterol (Calc): 150 mg/dL (calc) — ABNORMAL HIGH
Non-HDL Cholesterol (Calc): 179 mg/dL (calc) — ABNORMAL HIGH (ref <130)
Total CHOL/HDL Ratio: 5.5 (calc) — ABNORMAL HIGH (ref <5.0)
Triglycerides: 158 mg/dL — ABNORMAL HIGH (ref <150)

## 2019-07-29 MED ORDER — BENZONATATE 100 MG PO CAPS: 100.0000 mg | ORAL_CAPSULE | Freq: Three times a day (TID) | ORAL | 2 refills | Status: DC | PRN

## 2019-07-29 MED ORDER — LEVOCETIRIZINE DIHYDROCHLORIDE 5 MG PO TABS: 5.0000 mg | ORAL_TABLET | Freq: Every evening | ORAL | 2 refills | Status: DC

## 2019-07-29 MED ORDER — FLUTICASONE PROPIONATE 50 MCG/ACT NA SUSP: 2.0000 | Freq: Every day | NASAL | 6 refills | Status: DC

## 2019-07-29 NOTE — Patient Instructions (Addendum)
Let me know if you have any increase in coughing frequency or severity or increased mucous or sputum, shortness of breath, or increased wheeze - and we will want to see you to treat you for an exacerbation.  If you start to have worsening symptoms I would recommend using your symbicort 2 puffs twice a day for a few weeks, get mucinex and cough meds over the counter and use inhaler as needed, and if still having any worsening symptoms that's when we need to see you to get you extra medicines.   Chronic Obstructive Pulmonary Disease Exacerbation Chronic obstructive pulmonary disease (COPD) is a long-term (chronic) lung problem. In COPD, the flow of air from the lungs is limited. COPD exacerbations are times that breathing gets worse and you need more than your normal treatment. Without treatment, they can be life threatening. If they happen often, your lungs can become more damaged. If your COPD gets worse, your doctor may treat you with:  Medicines.  Oxygen.  Different ways to clear your airway, such as using a mask. Follow these instructions at home: Medicines  Take over-the-counter and prescription medicines only as told by your doctor.  If you take an antibiotic or steroid medicine, do not stop taking the medicine even if you start to feel better.  Keep up with shots (vaccinations) as told by your doctor. Be sure to get a yearly (annual) flu shot. Lifestyle  Do not smoke. If you need help quitting, ask your doctor.  Eat healthy foods.  Exercise regularly.  Get plenty of sleep.  Avoid tobacco smoke and other things that can bother your lungs.  Wash your hands often with soap and water. This will help keep you from getting an infection. If you cannot use soap and water, use hand sanitizer.  During flu season, avoid areas that are crowded with people. General instructions  Drink enough fluid to keep your pee (urine) clear or pale yellow. Do not do this if your doctor has told you  not to.  Use a cool mist machine (vaporizer).  If you use oxygen or a machine that turns medicine into a mist (nebulizer), continue to use it as told.  Follow all instructions for rehabilitation. These are steps you can take to make your body work better.  Keep all follow-up visits as told by your doctor. This is important. Contact a doctor if:  Your COPD symptoms get worse than normal. Get help right away if:  You are short of breath and it gets worse.  You have trouble talking.  You have chest pain.  You cough up blood.  You have a fever.  You keep throwing up (vomiting).  You feel weak or you pass out (faint).  You feel confused.  You are not able to sleep because of your symptoms.  You are not able to do daily activities. Summary  COPD exacerbations are times that breathing gets worse and you need more treatment than normal.  COPD exacerbations can be very serious and may cause your lungs to become more damaged.  Do not smoke. If you need help quitting, ask your doctor.  Stay up-to-date on your shots. Get a flu shot every year. This information is not intended to replace advice given to you by your health care provider. Make sure you discuss any questions you have with your health care provider. Document Revised: 06/08/2017 Document Reviewed: 07/31/2016 Elsevier Patient Education  [place].    Chronic Obstructive Pulmonary Disease Chronic obstructive pulmonary  disease (COPD) is a long-term (chronic) lung problem. When you have COPD, it is hard for air to get in and out of your lungs. Usually the condition gets worse over time, and your lungs will never return to normal. There are things you can do to keep yourself as healthy as possible.  Your doctor may treat your condition with: ? Medicines. ? Oxygen. ? Lung surgery.  Your doctor may also recommend: ? Rehabilitation. This includes steps to make your body work better. It may involve a team of  specialists. ? Quitting smoking, if you smoke. ? Exercise and changes to your diet. ? Comfort measures (palliative care). Follow these instructions at home: Medicines  Take over-the-counter and prescription medicines only as told by your doctor.  Talk to your doctor before taking any cough or allergy medicines. You may need to avoid medicines that cause your lungs to be dry. Lifestyle  If you smoke, stop. Smoking makes the problem worse. If you need help quitting, ask your doctor.  Avoid being around things that make your breathing worse. This may include smoke, chemicals, and fumes.  Stay active, but remember to rest as well.  Learn and use tips on how to relax.  Make sure you get enough sleep. Most adults need at least 7 hours of sleep every night.  Eat healthy foods. Eat smaller meals more often. Rest before meals. Controlled breathing Learn and use tips on how to control your breathing as told by your doctor. Try:  Breathing in (inhaling) through your nose for 1 second. Then, pucker your lips and breath out (exhale) through your lips for 2 seconds.  Putting one hand on your belly (abdomen). Breathe in slowly through your nose for 1 second. Your hand on your belly should move out. Pucker your lips and breathe out slowly through your lips. Your hand on your belly should move in as you breathe out.  Controlled coughing Learn and use controlled coughing to clear mucus from your lungs. Follow these steps: 1. Lean your head a little forward. 2. Breathe in deeply. 3. Try to hold your breath for 3 seconds. 4. Keep your mouth slightly open while coughing 2 times. 5. Spit any mucus out into a tissue. 6. Rest and do the steps again 1 or 2 times as needed. General instructions  Make sure you get all the shots (vaccines) that your doctor recommends. Ask your doctor about a flu shot and a pneumonia shot.  Use oxygen therapy and pulmonary rehabilitation if told by your doctor. If you  need home oxygen therapy, ask your doctor if you should buy a tool to measure your oxygen level (oximeter).  Make a COPD action plan with your doctor. This helps you to know what to do if you feel worse than usual.  Manage any other conditions you have as told by your doctor.  Avoid going outside when it is very hot, cold, or humid.  Avoid people who have a sickness you can catch (contagious).  Keep all follow-up visits as told by your doctor. This is important. Contact a doctor if:  You cough up more mucus than usual.  There is a change in the color or thickness of the mucus.  It is harder to breathe than usual.  Your breathing is faster than usual.  You have trouble sleeping.  You need to use your medicines more often than usual.  You have trouble doing your normal activities such as getting dressed or walking around the house. Get  help right away if:  You have shortness of breath while resting.  You have shortness of breath that stops you from: ? Being able to talk. ? Doing normal activities.  Your chest hurts for longer than 5 minutes.  Your skin color is more blue than usual.  Your pulse oximeter shows that you have low oxygen for longer than 5 minutes.  You have a fever.  You feel too tired to breathe normally. Summary  Chronic obstructive pulmonary disease (COPD) is a long-term lung problem.  The way your lungs work will never return to normal. Usually the condition gets worse over time. There are things you can do to keep yourself as healthy as possible.  Take over-the-counter and prescription medicines only as told by your doctor.  If you smoke, stop. Smoking makes the problem worse. This information is not intended to replace advice given to you by your health care provider. Make sure you discuss any questions you have with your health care provider. Document Revised: 06/08/2017 Document Reviewed: 07/31/2016 Elsevier Patient Education  2020 Elsevier  Inc.     Preventive Care 90-31 Years Old, Male Preventive care refers to lifestyle choices and visits with your health care provider that can promote health and wellness. This includes:  A yearly physical exam. This is also called an annual well check.  Regular dental and eye exams.  Immunizations.  Screening for certain conditions.  Healthy lifestyle choices, such as eating a healthy diet, getting regular exercise, not using drugs or products that contain nicotine and tobacco, and limiting alcohol use. What can I expect for my preventive care visit? Physical exam Your health care provider will check:  Height and weight. These may be used to calculate body mass index (BMI), which is a measurement that tells if you are at a healthy weight.  Heart rate and blood pressure.  Your skin for abnormal spots. Counseling Your health care provider may ask you questions about:  Alcohol, tobacco, and drug use.  Emotional well-being.  Home and relationship well-being.  Sexual activity.  Eating habits.  Work and work Statistician. What immunizations do I need?  Influenza (flu) vaccine  This is recommended every year. Tetanus, diphtheria, and pertussis (Tdap) vaccine  You may need a Td booster every 10 years. Varicella (chickenpox) vaccine  You may need this vaccine if you have not already been vaccinated. Zoster (shingles) vaccine  You may need this after age 45. Measles, mumps, and rubella (MMR) vaccine  You may need at least one dose of MMR if you were born in 1957 or later. You may also need a second dose. Pneumococcal conjugate (PCV13) vaccine  You may need this if you have certain conditions and were not previously vaccinated. Pneumococcal polysaccharide (PPSV23) vaccine  You may need one or two doses if you smoke cigarettes or if you have certain conditions. Meningococcal conjugate (MenACWY) vaccine  You may need this if you have certain conditions. Hepatitis A  vaccine  You may need this if you have certain conditions or if you travel or work in places where you may be exposed to hepatitis A. Hepatitis B vaccine  You may need this if you have certain conditions or if you travel or work in places where you may be exposed to hepatitis B. Haemophilus influenzae type b (Hib) vaccine  You may need this if you have certain risk factors. Human papillomavirus (HPV) vaccine  If recommended by your health care provider, you may need three doses over 6 months.  You may receive vaccines as individual doses or as more than one vaccine together in one shot (combination vaccines). Talk with your health care provider about the risks and benefits of combination vaccines. What tests do I need? Blood tests  Lipid and cholesterol levels. These may be checked every 5 years, or more frequently if you are over 68 years old.  Hepatitis C test.  Hepatitis B test. Screening  Lung cancer screening. You may have this screening every year starting at age 60 if you have a 30-pack-year history of smoking and currently smoke or have quit within the past 15 years.  Prostate cancer screening. Recommendations will vary depending on your family history and other risks.  Colorectal cancer screening. All adults should have this screening starting at age 82 and continuing until age 86. Your health care provider may recommend screening at age 51 if you are at increased risk. You will have tests every 1-10 years, depending on your results and the type of screening test.  Diabetes screening. This is done by checking your blood sugar (glucose) after you have not eaten for a while (fasting). You may have this done every 1-3 years.  Sexually transmitted disease (STD) testing. Follow these instructions at home: Eating and drinking  Eat a diet that includes fresh fruits and vegetables, whole grains, lean protein, and low-fat dairy products.  Take vitamin and mineral supplements as  recommended by your health care provider.  Do not drink alcohol if your health care provider tells you not to drink.  If you drink alcohol: ? Limit how much you have to 0-2 drinks a day. ? Be aware of how much alcohol is in your drink. In the [place] one drink equals one 12 oz bottle of beer (355 mL), one 5 oz glass of wine (148 mL), or one 1 oz glass of hard liquor (44 mL). Lifestyle  Take daily care of your teeth and gums.  Stay active. Exercise for at least 30 minutes on 5 or more days each week.  Do not use any products that contain nicotine or tobacco, such as cigarettes, e-cigarettes, and chewing tobacco. If you need help quitting, ask your health care provider.  If you are sexually active, practice safe sex. Use a condom or other form of protection to prevent STIs (sexually transmitted infections).  Talk with your health care provider about taking a low-dose aspirin every day starting at age 35. What's next?  Go to your health care provider once a year for a well check visit.  Ask your health care provider how often you should have your eyes and teeth checked.  Stay up to date on all vaccines. This information is not intended to replace advice given to you by your health care provider. Make sure you discuss any questions you have with your health care provider. Document Revised: 06/20/2018 Document Reviewed: 06/20/2018 Elsevier Patient Education  2020 [employer].
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
## 2019-07-30 ENCOUNTER — Other Ambulatory Visit: Payer: Self-pay | Admitting: Family Medicine

## 2019-07-30 DIAGNOSIS — E782 Mixed hyperlipidemia: Secondary | ICD-10-CM

## 2019-07-30 DIAGNOSIS — D582 Other hemoglobinopathies: Secondary | ICD-10-CM

## 2019-07-30 MED ORDER — ATORVASTATIN CALCIUM 20 MG PO TABS: 20.0000 mg | ORAL_TABLET | Freq: Every day | ORAL | 3 refills | Status: DC

## 2019-07-31 ENCOUNTER — Telehealth: Payer: Self-pay

## 2019-07-31 DIAGNOSIS — D582 Other hemoglobinopathies: Secondary | ICD-10-CM

## 2019-07-31 NOTE — Telephone Encounter (Signed)
-----   Message from Delsa Grana, [place] sent at 07/30/2019  2:18 PM EST ----- Please notify pt of results:  His red blood cells were high which can be caused by smoking or other causes like iron overload or genetic predispositions even some dehydration can make it look high. Please have him push fluids, cut back on smoking as much as he can and I would like to recheck this in the next month (labs only please order CBC for elevated Hgb)  His blood sugar, electrolytes, kidney function and liver function were wonderful and normal  His cholesterol was high and with other risks it is indicated for him to start a statin medication. I sent in lipitor 20 mg - take in evening before bed, with or without food Pt should work on avoiding trans fat, saturated fat in diet, add veggies and whole grains Would want to do a 3 month recheck with labs to see how he tolerated the meds  The 10-year ASCVD risk score Mikey Bussing DC Brooke Bonito., et al., 2013) is: 13.3%   Values used to calculate the score:     Age: 54 years     Sex: Male     Is Non-Hispanic African American: No     Diabetic: No     Tobacco smoker: Yes     Systolic Blood Pressure: [id number] mmHg     Is BP treated: No     HDL Cholesterol: 40 mg/dL     Total Cholesterol: 219 mg/dL

## 2020-01-26 ENCOUNTER — Other Ambulatory Visit: Payer: Self-pay

## 2020-01-26 ENCOUNTER — Ambulatory Visit: Payer: Medicaid Other | Admitting: Family Medicine

## 2020-01-26 ENCOUNTER — Encounter: Payer: Self-pay | Admitting: Family Medicine

## 2020-01-26 VITALS — BP 118/72 | HR 100 | Temp 98.1°F | Resp 18 | Ht 66.0 in | Wt 194.5 lb

## 2020-01-26 DIAGNOSIS — Z5181 Encounter for therapeutic drug level monitoring: Secondary | ICD-10-CM

## 2020-01-26 DIAGNOSIS — E669 Obesity, unspecified: Secondary | ICD-10-CM

## 2020-01-26 DIAGNOSIS — F17200 Nicotine dependence, unspecified, uncomplicated: Secondary | ICD-10-CM | POA: Diagnosis not present

## 2020-01-26 DIAGNOSIS — D582 Other hemoglobinopathies: Secondary | ICD-10-CM | POA: Diagnosis not present

## 2020-01-26 DIAGNOSIS — Z6831 Body mass index (BMI) 31.0-31.9, adult: Secondary | ICD-10-CM

## 2020-01-26 DIAGNOSIS — Z114 Encounter for screening for human immunodeficiency virus [HIV]: Secondary | ICD-10-CM | POA: Diagnosis not present

## 2020-01-26 DIAGNOSIS — J449 Chronic obstructive pulmonary disease, unspecified: Secondary | ICD-10-CM | POA: Diagnosis not present

## 2020-01-26 DIAGNOSIS — Z1159 Encounter for screening for other viral diseases: Secondary | ICD-10-CM | POA: Diagnosis not present

## 2020-01-26 DIAGNOSIS — E782 Mixed hyperlipidemia: Secondary | ICD-10-CM

## 2020-01-26 MED ORDER — ALBUTEROL SULFATE 108 (90 BASE) MCG/ACT IN AEPB: 2.0000 | INHALATION_SPRAY | RESPIRATORY_TRACT | 1 refills | Status: DC | PRN

## 2020-01-26 NOTE — Patient Instructions (Addendum)
Try and take your atorvastatin daily - or more often  The 10-year ASCVD risk score Mikey Bussing DC Brooke Bonito., et al., 2013) is: 11.6%   Values used to calculate the score:     Age: 54 years     Sex: Male     Is Non-Hispanic African American: No     Diabetic: No     Tobacco smoker: Yes     Systolic Blood Pressure: 397 mmHg     Is BP treated: No     HDL Cholesterol: 40 mg/dL     Total Cholesterol: 219 mg/dL    High Cholesterol  High cholesterol is a condition in which the blood has high levels of a white, waxy, fat-like substance (cholesterol). The human body needs small amounts of cholesterol. The liver makes all the cholesterol that the body needs. Extra (excess) cholesterol comes from the food that we eat. Cholesterol is carried from the liver by the blood through the blood vessels. If you have high cholesterol, deposits (plaques) may build up on the walls of your blood vessels (arteries). Plaques make the arteries narrower and stiffer. Cholesterol plaques increase your risk for heart attack and stroke. Work with your health care provider to keep your cholesterol levels in a healthy range. What increases the risk? This condition is more likely to develop in people who:  Eat foods that are high in animal fat (saturated fat) or cholesterol.  Are overweight.  Are not getting enough exercise.  Have a family history of high cholesterol. What are the signs or symptoms? There are no symptoms of this condition. How is this diagnosed? This condition may be diagnosed from the results of a blood test.  If you are older than age 82, your health care provider may check your cholesterol every 4-6 years.  You may be checked more often if you already have high cholesterol or other risk factors for heart disease. The blood test for cholesterol measures:  "Bad" cholesterol (LDL cholesterol). This is the main type of cholesterol that causes heart disease. The desired level for LDL is less than  100.  "Good" cholesterol (HDL cholesterol). This type helps to protect against heart disease by cleaning the arteries and carrying the LDL away. The desired level for HDL is 60 or higher.  Triglycerides. These are fats that the body can store or burn for energy. The desired number for triglycerides is lower than 150.  Total cholesterol. This is a measure of the total amount of cholesterol in your blood, including LDL cholesterol, HDL cholesterol, and triglycerides. A healthy number is less than 200. How is this treated? This condition is treated with diet changes, lifestyle changes, and medicines. Diet changes  This may include eating more whole grains, fruits, vegetables, nuts, and fish.  This may also include cutting back on red meat and foods that have a lot of added sugar. Lifestyle changes  Changes may include getting at least 40 minutes of aerobic exercise 3 times a week. Aerobic exercises include walking, biking, and swimming. Aerobic exercise along with a healthy diet can help you maintain a healthy weight.  Changes may also include quitting smoking. Medicines  Medicines are usually given if diet and lifestyle changes have failed to reduce your cholesterol to healthy levels.  Your health care provider may prescribe a statin medicine. Statin medicines have been shown to reduce cholesterol, which can reduce the risk of heart disease. Follow these instructions at home: Eating and drinking If told by your health care provider:  Eat chicken (without skin), fish, veal, shellfish, ground [place] breast, and round or loin cuts of red meat.  Do not eat fried foods or fatty meats, such as hot dogs and salami.  Eat plenty of fruits, such as apples.  Eat plenty of vegetables, such as broccoli, potatoes, and carrots.  Eat beans, peas, and lentils.  Eat grains such as barley, rice, couscous, and bulgur wheat.  Eat pasta without cream sauces.  Use skim or nonfat milk, and eat  low-fat or nonfat yogurt and cheeses.  Do not eat or drink whole milk, cream, ice cream, egg yolks, or hard cheeses.  Do not eat stick margarine or tub margarines that contain trans fats (also called partially hydrogenated oils).  Do not eat saturated tropical oils, such as coconut oil and palm oil.  Do not eat cakes, cookies, crackers, or other baked goods that contain trans fats.  General instructions  Exercise as directed by your health care provider. Increase your activity level with activities such as gardening, walking, and taking the stairs.  Take over-the-counter and prescription medicines only as told by your health care provider.  Do not use any products that contain nicotine or tobacco, such as cigarettes and e-cigarettes. If you need help quitting, ask your health care provider.  Keep all follow-up visits as told by your health care provider. This is important. Contact a health care provider if:  You are struggling to maintain a healthy diet or weight.  You need help to start on an exercise program.  You need help to stop smoking. Get help right away if:  You have chest pain.  You have trouble breathing. This information is not intended to replace advice given to you by your health care provider. Make sure you discuss any questions you have with your health care provider. Document Revised: 06/29/2017 Document Reviewed: 12/25/2015 Elsevier Patient Education  [place].
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
## 2020-01-27 LAB — COMPLETE METABOLIC PANEL WITH GFR
AG Ratio: 1.9 (calc) (ref 1.0–2.5)
ALT: 20 U/L (ref 9–46)
AST: 19 U/L (ref 10–35)
Albumin: 4.5 g/dL (ref 3.6–5.1)
Alkaline phosphatase (APISO): 73 U/L (ref 35–144)
BUN: 9 mg/dL (ref 7–25)
CO2: 29 mmol/L (ref 20–32)
Calcium: 9.6 mg/dL (ref 8.6–10.3)
Chloride: 105 mmol/L (ref 98–110)
Creat: 1.05 mg/dL (ref 0.70–1.33)
GFR, Est African American: 93 mL/min/{1.73_m2} (ref >=60)
GFR, Est Non African American: 80 mL/min/{1.73_m2} (ref >=60)
Globulin: 2.4 g/dL (calc) (ref 1.9–3.7)
Glucose, Bld: 100 mg/dL — ABNORMAL HIGH (ref 65–99)
Potassium: 4.5 mmol/L (ref 3.5–5.3)
Sodium: 139 mmol/L (ref 135–146)
Total Bilirubin: 0.5 mg/dL (ref 0.2–1.2)
Total Protein: 6.9 g/dL (ref 6.1–8.1)

## 2020-01-27 LAB — HEPATITIS C ANTIBODY
Hepatitis C Ab: NONREACTIVE
SIGNAL TO CUT-OFF: 0.02 (ref <1.00)

## 2020-01-27 LAB — CBC WITH DIFFERENTIAL/PLATELET
Absolute Monocytes: 808 cells/uL (ref 200–950)
Basophils Absolute: 19 cells/uL (ref 0–200)
Basophils Relative: 0.2 %
Eosinophils Absolute: 66 cells/uL (ref 15–500)
Eosinophils Relative: 0.7 %
HCT: 52.2 % — ABNORMAL HIGH (ref 38.5–50.0)
Hemoglobin: 17.8 g/dL — ABNORMAL HIGH (ref 13.2–17.1)
Lymphs Abs: 3826 cells/uL (ref 850–3900)
MCH: 33.4 pg — ABNORMAL HIGH (ref 27.0–33.0)
MCHC: 34.1 g/dL (ref 32.0–36.0)
MCV: 97.9 fL (ref 80.0–100.0)
MPV: 8.8 fL (ref 7.5–12.5)
Monocytes Relative: 8.6 %
Neutro Abs: 4681 cells/uL (ref 1500–7800)
Neutrophils Relative %: 49.8 %
Platelets: 319 10*3/uL (ref 140–400)
RBC: 5.33 10*6/uL (ref 4.20–5.80)
RDW: 13.1 % (ref 11.0–15.0)
Total Lymphocyte: 40.7 %
WBC: 9.4 10*3/uL (ref 3.8–10.8)

## 2020-01-27 LAB — LIPID PANEL
Cholesterol: 149 mg/dL (ref <200)
HDL: 51 mg/dL (ref >=40)
LDL Cholesterol (Calc): 77 mg/dL (calc)
Non-HDL Cholesterol (Calc): 98 mg/dL (calc) (ref <130)
Total CHOL/HDL Ratio: 2.9 (calc) (ref <5.0)
Triglycerides: 119 mg/dL (ref <150)

## 2020-01-27 LAB — HIV ANTIBODY (ROUTINE TESTING W REFLEX): HIV 1&2 Ab, 4th Generation: NONREACTIVE

## 2020-02-17 ENCOUNTER — Ambulatory Visit: Payer: Self-pay | Admitting: Internal Medicine

## 2020-03-25 ENCOUNTER — Other Ambulatory Visit: Payer: Self-pay | Admitting: Family Medicine

## 2020-03-25 DIAGNOSIS — J310 Chronic rhinitis: Secondary | ICD-10-CM

## 2020-04-20 ENCOUNTER — Other Ambulatory Visit: Payer: Self-pay | Admitting: Family Medicine

## 2020-04-20 DIAGNOSIS — J310 Chronic rhinitis: Secondary | ICD-10-CM

## 2020-07-28 ENCOUNTER — Ambulatory Visit: Payer: Medicaid Other | Admitting: Family Medicine

## 2020-08-12 ENCOUNTER — Ambulatory Visit: Payer: Medicaid Other | Admitting: Family Medicine

## 2020-08-12 ENCOUNTER — Encounter: Payer: Self-pay | Admitting: Family Medicine

## 2020-08-12 ENCOUNTER — Other Ambulatory Visit: Payer: Self-pay

## 2020-08-12 VITALS — BP 120/60 | HR 92 | Temp 98.7°F | Resp 16 | Ht 66.0 in | Wt 191.2 lb

## 2020-08-12 DIAGNOSIS — D582 Other hemoglobinopathies: Secondary | ICD-10-CM

## 2020-08-12 DIAGNOSIS — E782 Mixed hyperlipidemia: Secondary | ICD-10-CM

## 2020-08-12 DIAGNOSIS — J411 Mucopurulent chronic bronchitis: Secondary | ICD-10-CM | POA: Diagnosis not present

## 2020-08-12 DIAGNOSIS — Z5181 Encounter for therapeutic drug level monitoring: Secondary | ICD-10-CM | POA: Diagnosis not present

## 2020-08-12 DIAGNOSIS — F17200 Nicotine dependence, unspecified, uncomplicated: Secondary | ICD-10-CM

## 2020-08-12 DIAGNOSIS — J449 Chronic obstructive pulmonary disease, unspecified: Secondary | ICD-10-CM

## 2020-08-12 DIAGNOSIS — R0600 Dyspnea, unspecified: Secondary | ICD-10-CM

## 2020-08-12 DIAGNOSIS — J310 Chronic rhinitis: Secondary | ICD-10-CM | POA: Diagnosis not present

## 2020-08-12 DIAGNOSIS — R0609 Other forms of dyspnea: Secondary | ICD-10-CM

## 2020-08-12 DIAGNOSIS — Z23 Encounter for immunization: Secondary | ICD-10-CM | POA: Diagnosis not present

## 2020-08-12 MED ORDER — BENZONATATE 100 MG PO CAPS: 100.0000 mg | ORAL_CAPSULE | Freq: Three times a day (TID) | ORAL | 2 refills | Status: DC | PRN

## 2020-08-12 MED ORDER — ALBUTEROL SULFATE 108 (90 BASE) MCG/ACT IN AEPB: 2.0000 | INHALATION_SPRAY | RESPIRATORY_TRACT | 1 refills | Status: DC | PRN

## 2020-08-12 MED ORDER — LEVOCETIRIZINE DIHYDROCHLORIDE 5 MG PO TABS: ORAL_TABLET | ORAL | 5 refills | Status: DC

## 2020-08-12 MED ORDER — FLUTICASONE PROPIONATE 50 MCG/ACT NA SUSP: 2.0000 | Freq: Every day | NASAL | 6 refills | Status: DC

## 2020-08-12 MED ORDER — BUDESONIDE-FORMOTEROL FUMARATE 80-4.5 MCG/ACT IN AERO: 2.0000 | INHALATION_SPRAY | Freq: Two times a day (BID) | RESPIRATORY_TRACT | 3 refills | Status: DC

## 2020-08-12 MED ORDER — ATORVASTATIN CALCIUM 20 MG PO TABS: 20.0000 mg | ORAL_TABLET | Freq: Every day | ORAL | 3 refills | Status: DC
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
## 2020-08-12 NOTE — Patient Instructions (Signed)
Keep taking the lipitor and you will be due for labs in July   Keep up the good work with avoiding smoking  Let me know if you need anything

## 2021-01-11 ENCOUNTER — Ambulatory Visit: Payer: Medicaid Other | Admitting: Family Medicine

## 2021-01-13 ENCOUNTER — Other Ambulatory Visit: Payer: Self-pay

## 2021-01-13 ENCOUNTER — Ambulatory Visit: Payer: Medicaid Other | Admitting: Family Medicine

## 2021-01-13 ENCOUNTER — Encounter: Payer: Self-pay | Admitting: Family Medicine

## 2021-01-13 VITALS — BP 112/68 | HR 90 | Temp 97.8°F | Resp 16 | Ht 66.0 in | Wt 188.3 lb

## 2021-01-13 DIAGNOSIS — R0609 Other forms of dyspnea: Secondary | ICD-10-CM

## 2021-01-13 DIAGNOSIS — E782 Mixed hyperlipidemia: Secondary | ICD-10-CM | POA: Diagnosis not present

## 2021-01-13 DIAGNOSIS — J449 Chronic obstructive pulmonary disease, unspecified: Secondary | ICD-10-CM | POA: Diagnosis not present

## 2021-01-13 DIAGNOSIS — D582 Other hemoglobinopathies: Secondary | ICD-10-CM | POA: Diagnosis not present

## 2021-01-13 DIAGNOSIS — J411 Mucopurulent chronic bronchitis: Secondary | ICD-10-CM

## 2021-01-13 DIAGNOSIS — R29898 Other symptoms and signs involving the musculoskeletal system: Secondary | ICD-10-CM

## 2021-01-13 DIAGNOSIS — R0600 Dyspnea, unspecified: Secondary | ICD-10-CM

## 2021-01-13 DIAGNOSIS — F17200 Nicotine dependence, unspecified, uncomplicated: Secondary | ICD-10-CM

## 2021-01-13 DIAGNOSIS — R079 Chest pain, unspecified: Secondary | ICD-10-CM

## 2021-01-13 DIAGNOSIS — Z5181 Encounter for therapeutic drug level monitoring: Secondary | ICD-10-CM

## 2021-01-13 DIAGNOSIS — R2990 Unspecified symptoms and signs involving the nervous system: Secondary | ICD-10-CM

## 2021-01-13 DIAGNOSIS — J302 Other seasonal allergic rhinitis: Secondary | ICD-10-CM

## 2021-01-13 LAB — CBC WITH DIFFERENTIAL/PLATELET
Absolute Monocytes: 660 cells/uL (ref 200–950)
Basophils Absolute: 28 cells/uL (ref 0–200)
Basophils Relative: 0.4 %
Eosinophils Absolute: 92 cells/uL (ref 15–500)
Eosinophils Relative: 1.3 %
HCT: 49.8 % (ref 38.5–50.0)
Hemoglobin: 17.6 g/dL — ABNORMAL HIGH (ref 13.2–17.1)
Lymphs Abs: 2925 cells/uL (ref 850–3900)
MCH: 34.5 pg — ABNORMAL HIGH (ref 27.0–33.0)
MCHC: 35.3 g/dL (ref 32.0–36.0)
MCV: 97.6 fL (ref 80.0–100.0)
MPV: 8.3 fL (ref 7.5–12.5)
Monocytes Relative: 9.3 %
Neutro Abs: 3394 cells/uL (ref 1500–7800)
Neutrophils Relative %: 47.8 %
Platelets: 277 10*3/uL (ref 140–400)
RBC: 5.1 10*6/uL (ref 4.20–5.80)
RDW: 13.1 % (ref 11.0–15.0)
Total Lymphocyte: 41.2 %
WBC: 7.1 10*3/uL (ref 3.8–10.8)

## 2021-01-13 LAB — COMPLETE METABOLIC PANEL WITH GFR
AG Ratio: 1.6 (calc) (ref 1.0–2.5)
ALT: 23 U/L (ref 9–46)
AST: 21 U/L (ref 10–35)
Albumin: 4.4 g/dL (ref 3.6–5.1)
Alkaline phosphatase (APISO): 73 U/L (ref 35–144)
BUN: 9 mg/dL (ref 7–25)
CO2: 30 mmol/L (ref 20–32)
Calcium: 9.8 mg/dL (ref 8.6–10.3)
Chloride: 101 mmol/L (ref 98–110)
Creat: 1.02 mg/dL (ref 0.70–1.33)
GFR, Est African American: 96 mL/min/{1.73_m2} (ref >=60)
GFR, Est Non African American: 83 mL/min/{1.73_m2} (ref >=60)
Globulin: 2.7 g/dL (calc) (ref 1.9–3.7)
Glucose, Bld: 97 mg/dL (ref 65–99)
Potassium: 4.8 mmol/L (ref 3.5–5.3)
Sodium: 137 mmol/L (ref 135–146)
Total Bilirubin: 0.4 mg/dL (ref 0.2–1.2)
Total Protein: 7.1 g/dL (ref 6.1–8.1)

## 2021-01-13 LAB — LIPID PANEL
Cholesterol: 148 mg/dL (ref <200)
HDL: 44 mg/dL (ref >=40)
LDL Cholesterol (Calc): 85 mg/dL (calc)
Non-HDL Cholesterol (Calc): 104 mg/dL (calc) (ref <130)
Total CHOL/HDL Ratio: 3.4 (calc) (ref <5.0)
Triglycerides: 99 mg/dL (ref <150)

## 2021-01-13 MED ORDER — ASPIRIN 81 MG PO TBEC: 81.0000 mg | DELAYED_RELEASE_TABLET | Freq: Every day | ORAL | 3 refills | Status: DC

## 2021-01-13 MED ORDER — BUDESONIDE-FORMOTEROL FUMARATE 80-4.5 MCG/ACT IN AERO: 2.0000 | INHALATION_SPRAY | Freq: Two times a day (BID) | RESPIRATORY_TRACT | 5 refills | Status: DC
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
## 2021-01-24 ENCOUNTER — Telehealth: Payer: Self-pay

## 2021-01-24 NOTE — Telephone Encounter (Signed)
Copied from [place] [id number]. Topic: General - Other >> Jan 24, 2021 10:13 AM Celene Kras wrote: Reason for CRM: Pts sister calling on behalf of pt. She states that they received a message stating that the pt could go in for an MRI and are needing to know next steps. Please advise.

## 2021-01-24 NOTE — Telephone Encounter (Signed)
Pt sister notified.

## 2021-01-27 ENCOUNTER — Ambulatory Visit
Admission: RE | Admit: 2021-01-27 | Discharge: 2021-01-27 | Disposition: A | Discharge status: Home / Self Care | Payer: Self-pay | Source: Ambulatory Visit | Attending: Radiology | Admitting: Radiology

## 2021-01-27 ENCOUNTER — Other Ambulatory Visit: Payer: Self-pay | Admitting: Family Medicine

## 2021-01-27 ENCOUNTER — Other Ambulatory Visit: Payer: Self-pay | Admitting: Radiology

## 2021-01-27 DIAGNOSIS — Z1389 Encounter for screening for other disorder: Secondary | ICD-10-CM

## 2021-01-27 DIAGNOSIS — Z0189 Encounter for other specified special examinations: Secondary | ICD-10-CM

## 2021-01-31 ENCOUNTER — Other Ambulatory Visit: Payer: Self-pay

## 2021-01-31 ENCOUNTER — Ambulatory Visit: Payer: Self-pay | Admitting: *Deleted

## 2021-01-31 ENCOUNTER — Ambulatory Visit
Admission: RE | Admit: 2021-01-31 | Discharge: 2021-01-31 | Disposition: A | Discharge status: Home / Self Care | Payer: Medicaid Other | Source: Ambulatory Visit | Attending: Family Medicine | Admitting: Family Medicine

## 2021-01-31 DIAGNOSIS — R2990 Unspecified symptoms and signs involving the nervous system: Secondary | ICD-10-CM | POA: Insufficient documentation

## 2021-01-31 DIAGNOSIS — I639 Cerebral infarction, unspecified: Secondary | ICD-10-CM | POA: Diagnosis not present

## 2021-01-31 NOTE — Telephone Encounter (Signed)
Cheryl from radiology, [place] [phone] called to report stat MRI head without contrast results.  4-5 mm rounded (T1 hypointense)  focus with in the inferior aspect of the sella turcica . Pituitary microadenoma can not be excluded . Recommended pituitary protocol Brain MRI and relevant lab values. Attempted to call clinic at 4:54 no answer. Called on call provider, Dr. Ancil Boozer and reviewed stat results of MRI. No new orders at this time.

## 2021-02-01 NOTE — Telephone Encounter (Signed)
Pt notified and scheduled.

## 2021-02-08 ENCOUNTER — Other Ambulatory Visit: Payer: Self-pay

## 2021-02-08 ENCOUNTER — Encounter: Payer: Self-pay | Admitting: Family Medicine

## 2021-02-08 ENCOUNTER — Ambulatory Visit: Payer: Medicaid Other | Admitting: Family Medicine

## 2021-02-08 VITALS — BP 118/82 | HR 100 | Temp 98.1°F | Resp 16 | Ht 66.0 in | Wt 190.1 lb

## 2021-02-08 DIAGNOSIS — R519 Headache, unspecified: Secondary | ICD-10-CM

## 2021-02-08 DIAGNOSIS — R269 Unspecified abnormalities of gait and mobility: Secondary | ICD-10-CM | POA: Diagnosis not present

## 2021-02-08 DIAGNOSIS — R930 Abnormal findings on diagnostic imaging of skull and head, not elsewhere classified: Secondary | ICD-10-CM

## 2021-02-08 DIAGNOSIS — E236 Other disorders of pituitary gland: Secondary | ICD-10-CM | POA: Insufficient documentation

## 2021-02-08 DIAGNOSIS — I6789 Other cerebrovascular disease: Secondary | ICD-10-CM | POA: Diagnosis not present

## 2021-02-08 NOTE — Progress Notes (Signed)
Patient ID: Kirk Martin, male    DOB: 05-28-1966, 55 y.o.   MRN: [id number]  PCP: Delsa Grana, PA-C  Chief Complaint  Patient presents with   MRI results    Subjective:   Kirk Martin is a 55 y.o. male, presents to clinic with CC of the following:  HPI  Here for f/up on MRI findings which was done for remote stroke-like sx that resolved several months ago Microvascular disease and abnormal finding in sella turcica - possible pituitary adenoma  Patient notes increasing weakness on his left side he was having pain and weakness with his reported symptoms several months ago however at last office visit he had symmetrical grip strength and no focal neurological deficit He reports having difficulty with his balance and mobility sometimes tripping easily He has not had any slurred speech or facial droop He does report today that after coming out of the MRI scanner his auditory hallucinations which she has had for many decades finally went away and he feels the MRI fixed that for him  He has no history of abnormal calcium, vitamin D, alk phos, PTH, no breast changes, no visual disturbances No prior imagine of head/brain done   Patient Active Problem List   Diagnosis Date Noted   Mixed hyperlipidemia 07/30/2019   Elevated hemoglobin ([place]) 07/30/2019   Allergic rhinitis 07/29/2019   Chronic obstructive pulmonary disease ([facility]) 07/29/2019   Current smoker 06/26/2019   DOE (dyspnea on exertion) 06/26/2019      Current Outpatient Medications:    Albuterol Sulfate (PROAIR RESPICLICK) 245 (90 Base) MCG/ACT AEPB, Inhale 2 puffs into the lungs every 4 (four) hours as needed (wheeze, SOB, coughing fits)., Disp: 1 each, Rfl: 1   aspirin 81 MG EC tablet, Take 1 tablet (81 mg total) by mouth daily. Swallow whole., Disp: 90 tablet, Rfl: 3   atorvastatin (LIPITOR) 20 MG tablet, Take 1 tablet (20 mg total) by mouth at bedtime., Disp: 90 tablet, Rfl: 3   budesonide-formoterol  (SYMBICORT) 80-4.5 MCG/ACT inhaler, Inhale 2 puffs into the lungs 2 (two) times daily., Disp: 1 each, Rfl: 5   fluticasone (FLONASE) 50 MCG/ACT nasal spray, Place 2 sprays into both nostrils daily., Disp: 16 g, Rfl: 6   levocetirizine (XYZAL) 5 MG tablet, TAKE 1 TABLET BY MOUTH EVERY DAY IN THE EVENING, Disp: 30 tablet, Rfl: 5   LORazepam (ATIVAN) 2 MG tablet, SMARTSIG:1 Tablet(s) By Mouth, Disp: , Rfl:    No Known Allergies   Social History   Tobacco Use   Smoking status: Every Day    Packs/day: 1.00    Years: 40.00    Pack years: 40.00    Types: Cigarettes   Smokeless tobacco: Never  Vaping Use   Vaping Use: Never used  Substance Use Topics   Alcohol use: No    Alcohol/week: 0.0 standard drinks    Comment: 165 months since last drink   Drug use: No    Comment: denies      Chart Review Today: I personally reviewed active problem list, medication list, allergies, family history, social history, health maintenance, notes from last encounter, lab results, imaging with the patient/caregiver today.   Review of Systems  Constitutional: Negative.   HENT: Negative.    Eyes: Negative.   Respiratory: Negative.    Cardiovascular: Negative.   Gastrointestinal: Negative.   Endocrine: Negative.   Genitourinary: Negative.   Musculoskeletal: Negative.   Skin: Negative.   Allergic/Immunologic: Negative.   Neurological: Negative.  Hematological: Negative.   Psychiatric/Behavioral: Negative.    All other systems reviewed and are negative.     Objective:   Vitals:   02/08/21 0851  BP: 118/82  Pulse: 100  Resp: 16  Temp: 98.1 F (36.7 C)  SpO2: 98%  Weight: 190 lb 1.6 oz (86.2 kg)  Height: _0  (1.676 m)    Body mass index is 30.68 kg/m.  Physical Exam Vitals and nursing note reviewed.  Constitutional:      General: He is not in acute distress.    Appearance: Normal appearance. He is well-developed. He is obese. He is not ill-appearing, toxic-appearing or  diaphoretic.     Interventions: Face mask in place.  HENT:     Head: Normocephalic and atraumatic.     Jaw: No trismus.     Right Ear: Tympanic membrane, ear canal and external ear normal. There is no impacted cerumen.     Left Ear: Tympanic membrane, ear canal and external ear normal. There is no impacted cerumen.     Nose: No congestion or rhinorrhea.     Mouth/Throat:     Mouth: Mucous membranes are moist.     Pharynx: Oropharynx is clear. No oropharyngeal exudate or posterior oropharyngeal erythema.  Eyes:     General: Lids are normal. No scleral icterus.       Right eye: No discharge.        Left eye: No discharge.     Extraocular Movements: Extraocular movements intact.     Conjunctiva/sclera: Conjunctivae normal.     Pupils: Pupils are equal, round, and reactive to light.  Neck:     Trachea: Trachea and phonation normal. No tracheal deviation.  Cardiovascular:     Rate and Rhythm: Normal rate and regular rhythm.     Pulses: Normal pulses.          Radial pulses are 2+ on the right side and 2+ on the left side.       Posterior tibial pulses are 2+ on the right side and 2+ on the left side.     Heart sounds: Normal heart sounds. No murmur heard.   No friction rub. No gallop.  Pulmonary:     Effort: Pulmonary effort is normal. No respiratory distress.     Breath sounds: Normal breath sounds. No stridor. No wheezing, rhonchi or rales.  Abdominal:     General: Bowel sounds are normal. There is no distension.     Palpations: Abdomen is soft.     Tenderness: There is no abdominal tenderness. There is no guarding.  Musculoskeletal:     Cervical back: Normal range of motion and neck supple. No rigidity.     Right lower leg: No edema.     Left lower leg: No edema.  Lymphadenopathy:     Cervical: No cervical adenopathy.  Skin:    General: Skin is warm and dry.     Coloration: Skin is not jaundiced or pale.     Findings: No lesion or rash.     Nails: There is no clubbing.   Neurological:     General: No focal deficit present.     Mental Status: He is alert. Mental status is at baseline.     Cranial Nerves: No dysarthria or facial asymmetry.     Motor: No tremor or abnormal muscle tone.     Gait: Gait normal.     Comments:  CRANIAL NERVES:   II: Pupils equal and reactive, no RAPD  III, IV, VI: EOM intact, no gaze preference or deviation, no nystagmus.   V: normal sensation in V1, V2, and V3 segments bilaterally   VII: no asymmetry, no nasolabial fold flattening   VIII: normal hearing to speech   IX, X: normal palatal elevation, no uvular deviation   XI: 5/5 head turn and 5/5 shoulder shrug bilaterally   XII: midline tongue protrusion  MOTOR:  5/5 right grip strength and 4/5 left grip strength 5/5 strength dorsiflexion/plantarflexion b/l  SENSORY:  Normal to light touch Romberg absent  COORD: Normal finger to nose and heel to shin, no tremor, no dysmetria  GAIT: Normal    Psychiatric:        Attention and Perception: Perception normal. He does not perceive auditory or visual hallucinations.        Mood and Affect: Mood and affect normal.        Speech: Speech is tangential.        Behavior: Behavior is hyperactive. Behavior is not agitated or aggressive. Behavior is cooperative.     Results for orders placed or performed in visit on 01/13/21  CBC with Differential/Platelet  Result Value Ref Range   WBC 7.1 3.8 - 10.8 Thousand/uL   RBC 5.10 4.20 - 5.80 Million/uL   Hemoglobin 17.6 (H) 13.2 - 17.1 g/dL   HCT 49.8 38.5 - 50.0 %   MCV 97.6 80.0 - 100.0 fL   MCH 34.5 (H) 27.0 - 33.0 pg   MCHC 35.3 32.0 - 36.0 g/dL   RDW 13.1 11.0 - 15.0 %   Platelets 277 140 - 400 Thousand/uL   MPV 8.3 7.5 - 12.5 fL   Neutro Abs 3,394 1,500 - 7,800 cells/uL   Lymphs Abs 2,925 850 - 3,900 cells/uL   Absolute Monocytes 660 200 - 950 cells/uL   Eosinophils Absolute 92 15 - 500 cells/uL   Basophils Absolute 28 0 - 200 cells/uL   Neutrophils Relative % 47.8  %   Total Lymphocyte 41.2 %   Monocytes Relative 9.3 %   Eosinophils Relative 1.3 %   Basophils Relative 0.4 %  COMPLETE METABOLIC PANEL WITH GFR  Result Value Ref Range   Glucose, Bld 97 65 - 99 mg/dL   BUN 9 7 - 25 mg/dL   Creat 1.02 0.70 - 1.33 mg/dL   GFR, Est Non African American 83 > OR = 60 mL/min/1.63m   GFR, Est African American 96 > OR = 60 mL/min/1.746m  BUN/Creatinine Ratio NOT APPLICABLE 6 - 22 (calc)   Sodium 137 135 - 146 mmol/L   Potassium 4.8 3.5 - 5.3 mmol/L   Chloride 101 98 - 110 mmol/L   CO2 30 20 - 32 mmol/L   Calcium 9.8 8.6 - 10.3 mg/dL   Total Protein 7.1 6.1 - 8.1 g/dL   Albumin 4.4 3.6 - 5.1 g/dL   Globulin 2.7 1.9 - 3.7 g/dL (calc)   AG Ratio 1.6 1.0 - 2.5 (calc)   Total Bilirubin 0.4 0.2 - 1.2 mg/dL   Alkaline phosphatase (APISO) 73 35 - 144 U/L   AST 21 10 - 35 U/L   ALT 23 9 - 46 U/L  Lipid panel  Result Value Ref Range   Cholesterol 148 <200 mg/dL   HDL 44 > OR = 40 mg/dL   Triglycerides 99 <150 mg/dL   LDL Cholesterol (Calc) 85 mg/dL (calc)   Total CHOL/HDL Ratio 3.4 <5.0 (calc)   Non-HDL Cholesterol (Calc) 104 <130 mg/dL (calc)  Assessment & Plan:     ICD-10-CM   1. Unspecified abnormalities of gait and mobility  R26.9 Ambulatory referral to Neurology   He reports having more difficulty with his gait and tripping and falling more often no focal deficit to lower extremities noted today    2. 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 Abnormal MRI of head  R93.0 TSH    T4, free    Prolactin    Ambulatory referral to Neurology   Microvascular disease and abnormal sella turcica and pituitary mass noted with new left-sided upper extremity weakness follow-up with neurology    4. Cerebral microvascular disease  I67.89 Ambulatory referral to Neurology   He is on statin and aspirin    5.  Nonintractable episodic headache, unspecified headache type  R51.9 Ambulatory referral to Neurology   Unclear etiology of his headaches          Delsa Grana, PA-C 02/08/21 9:37 AM

## 2021-02-21 ENCOUNTER — Telehealth: Payer: Self-pay

## 2021-02-21 NOTE — Telephone Encounter (Signed)
Tried to call pt to inform him of his labs. VM not setup

## 2021-02-21 NOTE — Telephone Encounter (Signed)
-----   Message from Delsa Grana, [place] sent at 02/18/2021  4:54 PM EDT ----- Can someone please call the pt and let him know some labs are here to be done prior to going to the neurologist - ordered previously -

## 2021-02-21 NOTE — Telephone Encounter (Signed)
Pt is currently out of town and will come by to get labs done when he returns next week

## 2021-02-22 ENCOUNTER — Other Ambulatory Visit: Payer: Self-pay | Admitting: Family Medicine

## 2021-02-22 DIAGNOSIS — J310 Chronic rhinitis: Secondary | ICD-10-CM

## 2021-03-02 NOTE — Addendum Note (Signed)
Encounter addended by: Annie Paras on: 03/02/2021 9:58 AM  Actions taken: Letter saved

## 2021-04-18 ENCOUNTER — Other Ambulatory Visit: Payer: Self-pay | Admitting: Family Medicine

## 2021-04-18 DIAGNOSIS — J310 Chronic rhinitis: Secondary | ICD-10-CM

## 2021-05-09 DIAGNOSIS — R0683 Snoring: Secondary | ICD-10-CM | POA: Diagnosis not present

## 2021-05-09 DIAGNOSIS — D352 Benign neoplasm of pituitary gland: Secondary | ICD-10-CM | POA: Diagnosis not present

## 2021-05-09 DIAGNOSIS — R9082 White matter disease, unspecified: Secondary | ICD-10-CM | POA: Diagnosis not present

## 2021-05-30 ENCOUNTER — Other Ambulatory Visit: Payer: Self-pay | Admitting: Neurology

## 2021-05-30 DIAGNOSIS — D352 Benign neoplasm of pituitary gland: Secondary | ICD-10-CM

## 2021-06-03 ENCOUNTER — Ambulatory Visit: Admission: RE | Admit: 2021-06-03 | Payer: Medicaid Other | Source: Ambulatory Visit

## 2021-06-17 ENCOUNTER — Ambulatory Visit: Payer: Medicaid Other

## 2021-06-20 ENCOUNTER — Telehealth: Payer: Self-pay | Admitting: Family Medicine

## 2021-06-20 NOTE — Telephone Encounter (Signed)
Pt has MRI scheduled for 12.27.22 and was advised to contact office for some sort of sedation like ativan or lorazepam to knock the edge off for him to complete MRI/ please advise

## 2021-06-20 NOTE — Telephone Encounter (Signed)
Called Kennyth Lose to inform her, had to reach out to [place] office to get that order since he placed the order for the imaging.

## 2021-07-05 ENCOUNTER — Other Ambulatory Visit: Payer: Self-pay

## 2021-07-05 ENCOUNTER — Ambulatory Visit
Admission: RE | Admit: 2021-07-05 | Discharge: 2021-07-05 | Disposition: A | Discharge status: Home / Self Care | Payer: Medicaid Other | Source: Ambulatory Visit | Attending: Neurology | Admitting: Neurology

## 2021-07-05 DIAGNOSIS — D352 Benign neoplasm of pituitary gland: Secondary | ICD-10-CM | POA: Insufficient documentation

## 2021-07-05 DIAGNOSIS — J3489 Other specified disorders of nose and nasal sinuses: Secondary | ICD-10-CM | POA: Diagnosis not present

## 2021-07-05 MED ORDER — GADOBUTROL 1 MMOL/ML IV SOLN
9.0000 mL | Freq: Once | INTRAVENOUS | Status: AC | PRN
  Administered 2021-07-05: 10:00:00 9 mL via INTRAVENOUS

## 2021-07-15 ENCOUNTER — Ambulatory Visit: Payer: Medicaid Other | Admitting: Family Medicine

## 2021-07-18 ENCOUNTER — Ambulatory Visit: Payer: Medicaid Other | Admitting: Internal Medicine

## 2021-07-19 ENCOUNTER — Encounter: Payer: Self-pay | Admitting: Internal Medicine

## 2021-07-19 ENCOUNTER — Ambulatory Visit: Payer: Medicaid Other | Admitting: Internal Medicine

## 2021-07-19 ENCOUNTER — Other Ambulatory Visit: Payer: Self-pay

## 2021-07-19 ENCOUNTER — Other Ambulatory Visit: Payer: Self-pay | Admitting: Internal Medicine

## 2021-07-19 VITALS — BP 132/78 | HR 83 | Temp 97.8°F | Resp 16 | Ht 66.0 in | Wt 195.3 lb

## 2021-07-19 DIAGNOSIS — Z122 Encounter for screening for malignant neoplasm of respiratory organs: Secondary | ICD-10-CM

## 2021-07-19 DIAGNOSIS — J411 Mucopurulent chronic bronchitis: Secondary | ICD-10-CM

## 2021-07-19 DIAGNOSIS — J302 Other seasonal allergic rhinitis: Secondary | ICD-10-CM | POA: Diagnosis not present

## 2021-07-19 DIAGNOSIS — Z23 Encounter for immunization: Secondary | ICD-10-CM | POA: Diagnosis not present

## 2021-07-19 DIAGNOSIS — E782 Mixed hyperlipidemia: Secondary | ICD-10-CM | POA: Diagnosis not present

## 2021-07-19 DIAGNOSIS — Z720 Tobacco use: Secondary | ICD-10-CM | POA: Diagnosis not present

## 2021-07-19 DIAGNOSIS — E236 Other disorders of pituitary gland: Secondary | ICD-10-CM

## 2021-07-19 MED ORDER — ALBUTEROL SULFATE 108 (90 BASE) MCG/ACT IN AEPB: 2.0000 | INHALATION_SPRAY | RESPIRATORY_TRACT | 1 refills | Status: DC | PRN
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
## 2021-07-19 NOTE — Telephone Encounter (Signed)
Requested medication (s) are due for refill today - no  Requested medication (s) are on the active medication list -yes  Future visit scheduled -yes  Last refill: today  Notes to clinic: Pharmacy requesting alternative - sent for review   Requested Prescriptions  Pending Prescriptions Disp Refills   VENTOLIN HFA 108 (90 Base) MCG/ACT inhaler [Pharmacy Med Name: VENTOLIN HFA 90 MCG INHALER]  0     Pulmonology:  Beta Agonists Failed - 07/19/2021 11:57 AM      Failed - One inhaler should last at least one month. If the patient is requesting refills earlier, contact the patient to check for uncontrolled symptoms.      Passed - Valid encounter within last 12 months    Recent Outpatient Visits           Today Tobacco use   [facility] Teodora Medici, DO   5 months ago Unspecified abnormalities of gait and mobility   [facility] Delsa Grana, PA-C   6 months ago Mixed hyperlipidemia   [facility] Delsa Grana, [place]   11 months ago Chronic bronchitis with productive mucopurulent cough [facility])   [facility] Delsa Grana, PA-C   1 year ago Mixed hyperlipidemia   [facility] Delsa Grana, PA-C       Future Appointments             In 6 months Teodora Medici, [facility], [facility]               Requested Prescriptions  Pending Prescriptions Disp Refills   VENTOLIN HFA 108 (90 Base) MCG/ACT inhaler [Pharmacy Med Name: VENTOLIN HFA 90 MCG INHALER]  0     Pulmonology:  Beta Agonists Failed - 07/19/2021 11:57 AM      Failed - One inhaler should last at least one month. If the patient is requesting refills earlier, contact the patient to check for uncontrolled symptoms.      Passed - Valid encounter within last 12 months    Recent Outpatient Visits           Today Tobacco use   [facility] Teodora Medici, DO   5 months ago  Unspecified abnormalities of gait and mobility   [facility] Delsa Grana, PA-C   6 months ago Mixed hyperlipidemia   [facility] Delsa Grana, [place]   11 months ago Chronic bronchitis with productive mucopurulent cough [facility])   [facility] Delsa Grana, PA-C   1 year ago Mixed hyperlipidemia   [facility] Delsa Grana, PA-C       Future Appointments             In 6 months Teodora Medici, [facility], [facility]

## 2021-07-19 NOTE — Patient Instructions (Addendum)
It was great seeing you today!  Plan discussed at today's visit: -For COPD: use Symbicort daily and Albuterol as needed for wheezing/shortness of breath -Continue medications -Pneumonia vaccine today -Albuterol refilled  -Lung cancer screening ordered, we will call you to set up this test -Follow up with Neurology   Follow up in: 6 months  Take care and let us know if you have any questions or concerns prior to your next visit.  Dr. Rosana Berger

## 2021-08-09 DIAGNOSIS — R000 Tachycardia, unspecified: Secondary | ICD-10-CM | POA: Diagnosis not present

## 2021-08-09 DIAGNOSIS — R9082 White matter disease, unspecified: Secondary | ICD-10-CM | POA: Diagnosis not present

## 2021-08-09 DIAGNOSIS — G4710 Hypersomnia, unspecified: Secondary | ICD-10-CM | POA: Diagnosis not present

## 2021-08-09 DIAGNOSIS — R0609 Other forms of dyspnea: Secondary | ICD-10-CM | POA: Diagnosis not present

## 2021-08-09 DIAGNOSIS — R42 Dizziness and giddiness: Secondary | ICD-10-CM | POA: Diagnosis not present

## 2021-08-09 DIAGNOSIS — J449 Chronic obstructive pulmonary disease, unspecified: Secondary | ICD-10-CM | POA: Diagnosis not present

## 2021-08-09 DIAGNOSIS — R0683 Snoring: Secondary | ICD-10-CM | POA: Diagnosis not present

## 2021-08-15 ENCOUNTER — Other Ambulatory Visit: Payer: Self-pay

## 2021-08-15 ENCOUNTER — Ambulatory Visit: Payer: Medicaid Other | Attending: Neurology

## 2021-08-15 DIAGNOSIS — R42 Dizziness and giddiness: Secondary | ICD-10-CM | POA: Diagnosis not present
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
## 2021-08-15 NOTE — Patient Instructions (Signed)
Access Code: [email or web address] URL: [email or web address] Date: 08/15/2021 Prepared by: Roxana Hires  Exercises Self-Epley Maneuver Right Ear - 1 x daily - 7 x weekly - 3 reps - 60s (1 minute) in each position) hold

## 2021-08-16 DIAGNOSIS — J410 Simple chronic bronchitis: Secondary | ICD-10-CM | POA: Diagnosis not present

## 2021-08-16 DIAGNOSIS — R0602 Shortness of breath: Secondary | ICD-10-CM | POA: Diagnosis not present

## 2021-08-16 DIAGNOSIS — R000 Tachycardia, unspecified: Secondary | ICD-10-CM | POA: Diagnosis not present

## 2021-08-17 ENCOUNTER — Ambulatory Visit: Payer: Medicaid Other

## 2021-08-22 ENCOUNTER — Ambulatory Visit: Payer: Medicaid Other

## 2021-08-29 ENCOUNTER — Other Ambulatory Visit: Payer: Self-pay

## 2021-08-29 ENCOUNTER — Ambulatory Visit: Payer: Medicaid Other

## 2021-08-29 DIAGNOSIS — R42 Dizziness and giddiness: Secondary | ICD-10-CM | POA: Diagnosis not present
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
## 2021-09-06 DIAGNOSIS — R0602 Shortness of breath: Secondary | ICD-10-CM | POA: Diagnosis not present

## 2021-09-06 DIAGNOSIS — F17210 Nicotine dependence, cigarettes, uncomplicated: Secondary | ICD-10-CM | POA: Diagnosis not present

## 2021-09-06 DIAGNOSIS — J439 Emphysema, unspecified: Secondary | ICD-10-CM | POA: Diagnosis not present

## 2021-09-06 DIAGNOSIS — F17201 Nicotine dependence, unspecified, in remission: Secondary | ICD-10-CM | POA: Diagnosis not present

## 2021-09-06 DIAGNOSIS — R000 Tachycardia, unspecified: Secondary | ICD-10-CM | POA: Diagnosis not present

## 2021-09-07 DIAGNOSIS — Z23 Encounter for immunization: Secondary | ICD-10-CM | POA: Diagnosis not present

## 2021-09-07 DIAGNOSIS — R0602 Shortness of breath: Secondary | ICD-10-CM | POA: Diagnosis not present

## 2021-09-07 DIAGNOSIS — R000 Tachycardia, unspecified: Secondary | ICD-10-CM | POA: Diagnosis not present

## 2021-09-07 DIAGNOSIS — J410 Simple chronic bronchitis: Secondary | ICD-10-CM | POA: Diagnosis not present

## 2021-10-26 ENCOUNTER — Other Ambulatory Visit: Payer: Self-pay | Admitting: *Deleted

## 2021-10-26 DIAGNOSIS — Z122 Encounter for screening for malignant neoplasm of respiratory organs: Secondary | ICD-10-CM

## 2021-10-26 DIAGNOSIS — Z87891 Personal history of nicotine dependence: Secondary | ICD-10-CM

## 2021-10-26 DIAGNOSIS — F17210 Nicotine dependence, cigarettes, uncomplicated: Secondary | ICD-10-CM

## 2021-11-22 ENCOUNTER — Encounter: Payer: Self-pay | Admitting: Acute Care

## 2021-11-22 ENCOUNTER — Ambulatory Visit (INDEPENDENT_AMBULATORY_CARE_PROVIDER_SITE_OTHER): Payer: Medicaid Other | Admitting: Acute Care

## 2021-11-22 DIAGNOSIS — F17210 Nicotine dependence, cigarettes, uncomplicated: Secondary | ICD-10-CM

## 2021-11-22 NOTE — Patient Instructions (Signed)
Thank you for participating in the [place] Lung Cancer Screening Program. It was our pleasure to meet you today. We will call you with the results of your scan within the next few days. Your scan will be assigned a Lung RADS category score by the physicians reading the scans.  This Lung RADS score determines follow up scanning.  See below for description of categories, and follow up screening recommendations. We will be in touch to schedule your follow up screening annually or based on recommendations of our providers. We will fax a copy of your scan results to your Primary Care Physician, or the physician who referred you to the program, to ensure they have the results. Please call the office if you have any questions or concerns regarding your scanning experience or results.  Our office number is [phone]. Please speak with Denise Phelps, RN. , or  Denise Buckner RN, They are  our Lung Cancer Screening RN.'s If They are unavailable when you call, Please leave a message on the voice mail. We will return your call at our earliest convenience.This voice mail is monitored several times a day.  Remember, if your scan is normal, we will scan you annually as long as you continue to meet the criteria for the program. (Age 55-77, Current smoker or smoker who has quit within the last 15 years). If you are a smoker, remember, quitting is the single most powerful action that you can take to decrease your risk of lung cancer and other pulmonary, breathing related problems. We know quitting is hard, and we are here to help.  Please let us know if there is anything we can do to help you meet your goal of quitting. If you are a former smoker, congratulations. We are proud of you! Remain smoke free! Remember you can refer friends or family members through the number above.  We will screen them to make sure they meet criteria for the program. Thank you for helping us take better care of you by  participating in Lung Screening.  You can receive free nicotine replacement therapy ( patches, gum or mints) by calling 1-800-QUIT NOW. Please call so we can get you on the path to becoming  a non-smoker. I know it is hard, but you can do this!  Lung RADS Categories:  Lung RADS 1: no nodules or definitely non-concerning nodules.  Recommendation is for a repeat annual scan in 12 months.  Lung RADS 2:  nodules that are non-concerning in appearance and behavior with a very low likelihood of becoming an active cancer. Recommendation is for a repeat annual scan in 12 months.  Lung RADS 3: nodules that are probably non-concerning , includes nodules with a low likelihood of becoming an active cancer.  Recommendation is for a 6-month repeat screening scan. Often noted after an upper respiratory illness. We will be in touch to make sure you have no questions, and to schedule your 6-month scan.  Lung RADS 4 A: nodules with concerning findings, recommendation is most often for a follow up scan in 3 months or additional testing based on our provider's assessment of the scan. We will be in touch to make sure you have no questions and to schedule the recommended 3 month follow up scan.  Lung RADS 4 B:  indicates findings that are concerning. We will be in touch with you to schedule additional diagnostic testing based on our provider's  assessment of the scan.  Other options for assistance in smoking cessation (   As covered by your insurance benefits)  Hypnosis for smoking cessation  [employer] [phone]  Acupuncture for smoking cessation  [facility] [phone]   

## 2021-11-22 NOTE — Progress Notes (Signed)
Virtual Visit via Telephone Note ? ?I connected with Kirk Martin on 11/22/21 at  4:30 PM EDT by telephone and verified that I am speaking with the correct person using two identifiers. ? ?Location: ?Patient:  At home ?Provider: [place], [place], [place], [address]  ?  ?I discussed the limitations, risks, security and privacy concerns of performing an evaluation and management service by telephone and the availability of in person appointments. I also discussed with the patient that there may be a patient responsible charge related to this service. The patient expressed understanding and agreed to proceed. ? ? ?Shared Decision Making Visit Lung Cancer Screening Program ?([id number]) ? ? ?Eligibility: ?Age 56 y.o. ?Pack Years Smoking History Calculation 65 pack year smoking history ?(# packs/per year x # years smoked) ?Recent History of coughing up blood  no ?Unexplained weight loss? no ?( >Than 15 pounds within the last 6 months ) ?Prior History Lung / other cancer no ?(Diagnosis within the last 5 years already requiring surveillance chest CT Scans). ?Smoking Status Current Smoker ?Former Smokers: Years since quit:  NA ? Quit Date:  NA ? ?Visit Components: ?Discussion included one or more decision making aids. yes ?Discussion included risk/benefits of screening. yes ?Discussion included potential follow up diagnostic testing for abnormal scans. yes ?Discussion included meaning and risk of over diagnosis. yes ?Discussion included meaning and risk of False Positives. yes ?Discussion included meaning of total radiation exposure. yes ? ?Counseling Included: ?Importance of adherence to annual lung cancer LDCT screening. yes ?Impact of comorbidities on ability to participate in the program. yes ?Ability and willingness to under diagnostic treatment. yes ? ?Smoking Cessation Counseling: ?Current Smokers:  ?Discussed importance of smoking cessation. yes ?Information about tobacco cessation classes and  interventions provided to patient. yes ?Patient provided with "ticket" for LDCT Scan. yes ?Symptomatic Patient. no ? Counseling NA ?Diagnosis Code: Tobacco Use Z72.0 ?Asymptomatic Patient yes ? Counseling (Intermediate counseling: > three minutes counseling) L8921 ?Former Smokers:  ?Discussed the importance of maintaining cigarette abstinence. yes ?Diagnosis Code: Personal History of Nicotine Dependence. J94.174 ?Information about tobacco cessation classes and interventions provided to patient. Yes ?Patient provided with "ticket" for LDCT Scan. yes ?Written Order for Lung Cancer Screening with LDCT placed in Epic. Yes ?(CT Chest Lung Cancer Screening Low Dose W/O CM) [id number] ?Z12.2-Screening of respiratory organs ?Z87.891-Personal history of nicotine dependence ? ?I have spent 25 minutes of face to face/ virtual visit   time with  Kirk Martin discussing the risks and benefits of lung cancer screening. We viewed / discussed a power point together that explained in detail the above noted topics. We paused at intervals to allow for questions to be asked and answered to ensure understanding.We discussed that the single most powerful action that he can take to decrease his risk of developing lung cancer is to quit smoking. We discussed whether or not he is ready to commit to setting a quit date. We discussed options for tools to aid in quitting smoking including nicotine replacement therapy, non-nicotine medications, support groups, Quit Smart classes, and behavior modification. We discussed that often times setting smaller, more achievable goals, such as eliminating 1 cigarette a day for a week and then 2 cigarettes a day for a week can be helpful in slowly decreasing the number of cigarettes smoked. This allows for a sense of accomplishment as well as providing a clinical benefit. I provided  him  with smoking cessation  information  with contact information for community resources, classes,  free nicotine replacement  therapy, and access to mobile apps, text messaging, and on-line smoking cessation help. I have also provided  him  the office contact information in the event he needs to contact me, or the screening staff. We discussed the time and location of the scan, and that either Doroteo Glassman RN, Joella Prince, RN  or I will call / send a letter with the results within 24-72 hours of receiving them. The patient verbalized understanding of all of  the above and had no further questions upon leaving the office. They have my contact information in the event they have any further questions. ? ?I spent 3 minutes counseling on smoking cessation and the health risks of continued tobacco abuse. ? ?I explained to the patient that there has been a high incidence of coronary artery disease noted on these exams. I explained that this is a non-gated exam therefore degree or severity cannot be determined. This patient is on statin therapy. I have asked the patient to follow-up with their PCP regarding any incidental finding of coronary artery disease and management with diet or medication as their PCP  feels is clinically indicated. The patient verbalized understanding of the above and had no further questions upon completion of the visit. ? ?  ? ? ?Magdalen Spatz, NP ?11/22/2021 ? ? ? ? ? ? ?

## 2021-11-23 ENCOUNTER — Ambulatory Visit
Admission: RE | Admit: 2021-11-23 | Discharge: 2021-11-23 | Disposition: A | Discharge status: Home / Self Care | Payer: Medicaid Other | Source: Ambulatory Visit | Attending: Acute Care | Admitting: Acute Care

## 2021-11-23 DIAGNOSIS — F17210 Nicotine dependence, cigarettes, uncomplicated: Secondary | ICD-10-CM | POA: Diagnosis not present

## 2021-11-23 DIAGNOSIS — Z87891 Personal history of nicotine dependence: Secondary | ICD-10-CM | POA: Diagnosis not present

## 2021-11-23 DIAGNOSIS — Z122 Encounter for screening for malignant neoplasm of respiratory organs: Secondary | ICD-10-CM | POA: Diagnosis not present

## 2021-11-25 DIAGNOSIS — R0602 Shortness of breath: Secondary | ICD-10-CM | POA: Diagnosis not present

## 2021-11-25 DIAGNOSIS — R000 Tachycardia, unspecified: Secondary | ICD-10-CM | POA: Diagnosis not present

## 2021-12-01 ENCOUNTER — Telehealth: Payer: Self-pay | Admitting: Acute Care

## 2021-12-01 ENCOUNTER — Other Ambulatory Visit: Payer: Self-pay

## 2021-12-01 DIAGNOSIS — Z87891 Personal history of nicotine dependence: Secondary | ICD-10-CM

## 2021-12-01 DIAGNOSIS — Z122 Encounter for screening for malignant neoplasm of respiratory organs: Secondary | ICD-10-CM

## 2021-12-01 DIAGNOSIS — R911 Solitary pulmonary nodule: Secondary | ICD-10-CM

## 2021-12-01 DIAGNOSIS — F17210 Nicotine dependence, cigarettes, uncomplicated: Secondary | ICD-10-CM

## 2021-12-01 NOTE — Telephone Encounter (Signed)
I have called Dr. Teodoro Kil office to let them know the Low Dose CT Chest done on this patient was read as a LR 3. There is a 6.8 mm nodule in the right lung base. We will order a 6 month follow up and let Dr. Lanney Gins know the results. Langley Gauss, please call the patient with his results. Please order 6 month follow up LDCT. Let him know we are letting Dr. Letitia Libra know about the ILD finding and make sure he follows up with them.  I have called [facility]ef[facility]  to make sure Dr. Lanney Gins is aware of the new finding that looks like potential ILD, and to let them know recommendation is for HRCT to better evaluate. He will receive a message from his staff, as well as being tagged on this telephone message.

## 2021-12-01 NOTE — Telephone Encounter (Signed)
Results of LDCT called to patient.  This was initial LCS LDCT.  Findings were emphysema and atherosclerosis and patient is being treated for hyperlipidemia and COPD.  Also noted was a lung nodule, with recommendation for 6 month follow up CT Chest. This is a precautionary measure, as there is no previous scan to review the lung nodule for stability.  There is also a mention of possible ILD.  Patient sees Dr. Lanney Gins and our provider, Eric Form NP has contacted his office to have him review scan and make any further recommendations for care.  Patient acknowledged understanding.  PCP also has been faxed results with plan and notification for Dr. Lanney Gins to review findings.  Order placed for 6 month follow up CT.  Patient had no further questions

## 2022-01-12 ENCOUNTER — Other Ambulatory Visit: Payer: Self-pay | Admitting: Family Medicine

## 2022-01-12 DIAGNOSIS — E782 Mixed hyperlipidemia: Secondary | ICD-10-CM

## 2022-01-12 DIAGNOSIS — R29898 Other symptoms and signs involving the musculoskeletal system: Secondary | ICD-10-CM

## 2022-01-12 DIAGNOSIS — R079 Chest pain, unspecified: Secondary | ICD-10-CM

## 2022-01-12 DIAGNOSIS — R2990 Unspecified symptoms and signs involving the nervous system: Secondary | ICD-10-CM

## 2022-01-12 DIAGNOSIS — F17200 Nicotine dependence, unspecified, uncomplicated: Secondary | ICD-10-CM
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
## 2022-01-16 ENCOUNTER — Ambulatory Visit: Payer: Medicaid Other | Admitting: Internal Medicine

## 2022-05-29 ENCOUNTER — Ambulatory Visit: Admission: RE | Admit: 2022-05-29 | Payer: Medicaid Other | Source: Ambulatory Visit

## 2022-05-29 ENCOUNTER — Ambulatory Visit: Payer: Medicaid Other

## 2022-06-13 ENCOUNTER — Encounter: Payer: Medicaid Other | Admitting: Family Medicine

## 2022-06-16 NOTE — Patient Instructions (Signed)

## 2022-06-19 ENCOUNTER — Ambulatory Visit (INDEPENDENT_AMBULATORY_CARE_PROVIDER_SITE_OTHER): Payer: Medicaid Other | Admitting: Family Medicine

## 2022-06-19 ENCOUNTER — Encounter: Payer: Self-pay | Admitting: Family Medicine

## 2022-06-19 VITALS — BP 116/74 | HR 102 | Temp 97.5°F | Resp 16 | Ht 66.0 in | Wt 177.7 lb

## 2022-06-19 DIAGNOSIS — R000 Tachycardia, unspecified: Secondary | ICD-10-CM

## 2022-06-19 DIAGNOSIS — R9389 Abnormal findings on diagnostic imaging of other specified body structures: Secondary | ICD-10-CM

## 2022-06-19 DIAGNOSIS — Z23 Encounter for immunization: Secondary | ICD-10-CM

## 2022-06-19 DIAGNOSIS — J439 Emphysema, unspecified: Secondary | ICD-10-CM

## 2022-06-19 DIAGNOSIS — Z0000 Encounter for general adult medical examination without abnormal findings: Secondary | ICD-10-CM

## 2022-06-19 DIAGNOSIS — F17210 Nicotine dependence, cigarettes, uncomplicated: Secondary | ICD-10-CM

## 2022-06-19 DIAGNOSIS — F40298 Other specified phobia: Secondary | ICD-10-CM

## 2022-06-19 DIAGNOSIS — I6789 Other cerebrovascular disease: Secondary | ICD-10-CM

## 2022-06-19 DIAGNOSIS — Z720 Tobacco use: Secondary | ICD-10-CM

## 2022-06-19 DIAGNOSIS — F17200 Nicotine dependence, unspecified, uncomplicated: Secondary | ICD-10-CM

## 2022-06-19 DIAGNOSIS — J411 Mucopurulent chronic bronchitis: Secondary | ICD-10-CM

## 2022-06-19 DIAGNOSIS — E782 Mixed hyperlipidemia: Secondary | ICD-10-CM | POA: Diagnosis not present

## 2022-06-19 DIAGNOSIS — D582 Other hemoglobinopathies: Secondary | ICD-10-CM | POA: Diagnosis not present

## 2022-06-19 DIAGNOSIS — H8110 Benign paroxysmal vertigo, unspecified ear: Secondary | ICD-10-CM | POA: Diagnosis not present

## 2022-06-19 DIAGNOSIS — J310 Chronic rhinitis: Secondary | ICD-10-CM

## 2022-06-19 DIAGNOSIS — R569 Unspecified convulsions: Secondary | ICD-10-CM | POA: Diagnosis not present

## 2022-06-19 DIAGNOSIS — D352 Benign neoplasm of pituitary gland: Secondary | ICD-10-CM

## 2022-06-19 DIAGNOSIS — R930 Abnormal findings on diagnostic imaging of skull and head, not elsewhere classified: Secondary | ICD-10-CM

## 2022-06-19 DIAGNOSIS — R269 Unspecified abnormalities of gait and mobility: Secondary | ICD-10-CM | POA: Diagnosis not present

## 2022-06-19 DIAGNOSIS — R55 Syncope and collapse: Secondary | ICD-10-CM | POA: Diagnosis not present

## 2022-06-19 DIAGNOSIS — Z125 Encounter for screening for malignant neoplasm of prostate: Secondary | ICD-10-CM

## 2022-06-19 MED ORDER — SYMBICORT 160-4.5 MCG/ACT IN AERO: 2.0000 | INHALATION_SPRAY | Freq: Two times a day (BID) | RESPIRATORY_TRACT | 3 refills | Status: DC

## 2022-06-19 MED ORDER — ATORVASTATIN CALCIUM 20 MG PO TABS: 20.0000 mg | ORAL_TABLET | Freq: Every day | ORAL | 3 refills | Status: DC

## 2022-06-19 MED ORDER — LORAZEPAM 0.5 MG PO TABS: 0.5000 mg | ORAL_TABLET | Freq: Once | ORAL | 1 refills | Status: DC | PRN

## 2022-06-19 MED ORDER — LEVOCETIRIZINE DIHYDROCHLORIDE 5 MG PO TABS: 5.0000 mg | ORAL_TABLET | Freq: Every evening | ORAL | 3 refills | Status: DC | PRN

## 2022-06-19 MED ORDER — MECLIZINE HCL 25 MG PO TABS: 12.5000 mg | ORAL_TABLET | Freq: Three times a day (TID) | ORAL | 2 refills | Status: DC | PRN

## 2022-06-19 NOTE — Progress Notes (Signed)
Patient ID: Kirk Martin, male    DOB: [birth year], 56 y.o.   MRN: [id number]  PCP: Delsa Grana, PA-C  Chief Complaint  Patient presents with   Dizziness    Weekend felt dizzy, pt woke up about a month ago on the floor unsure how it happened and when he woke up he his arm was shaking   Headache    Frequent headaches   Tick Removal    Pt states has had a tick over a month near his testicles. Looks purple    Subjective:   Kirk Martin is a 56 y.o. male, presents to clinic with CC of the following:  HPI  Patient is here with his daughter, Kirk Martin she currently lives with him He was scheduled for a physical and I have not seen him for about a year and 4 months, and he has multiple complaints today  He reports about 1 month ago he had a LOC episode in the morning and woke up on the floor, did not recall how he got there, with his right arm and hand were contracted/shaking, he did not have loss of bowels/urine, he noted pain to the back of head neck the next day He did not tell anyone about the incident until the next day.  The patient and his daughter report that he has been having frequent vertigo episodes, they do occur with head movements particularly looking up and down, he reports the sensation of dizziness and losing balance in the room feeling like it spinning, the symptoms are brief only last a few seconds and then resolve he has not had any falls or syncopal episodes associated with this that he knows of and he cannot think of anything else that occurred prior to the episode of loss of consciousness  Prior physical therapy - for vertigo, sx returning easily with head movements He previously went to physical therapy and did several sessions and was given home exercises to do and thinking back he and his daughter both state that it is very similar to his current dizzy episodes  He previously had some abnormality on MRI and was referred to neurology for concerns of  having stroke he reported multiple symptoms which were difficult to reproduce and not evident on his physical exam but included episodes of dizziness, periods of weakness and intermittent gait and balance abnormalities and disturbances.  We did imaging and it did find some abnormal findings in his sella turcica and pituitary gland and he was then seen by Dr. Brigitte Pulse.  His imaging was repeated by them and the abnormality/mass had disappeared and Dr. Brigitte Pulse said he should recheck the imaging in 1 year but he did not need to see neurology any further Unfortunately earlier this year the patient reports that he lost his insurance and for about the past 10 months has not done much care or gotten his medications  He reports he is currently smoking but not very much only 3 to 5 cigarettes/day and he continues to endorse improved day-to-day respiratory symptoms compared to when he was smoking heavily a few years ago. He has stopped using all of his inhalers and he notes only having productive cough smokers cough or congestion every few days He does endorse however getting short of breath with mild activity He previously was doing lung cancer screening but was lost to follow-up and did need a 16-monthrepeat chest CT which she would be currently due/overdue for  He has not been taking his  cholesterol medications or baby aspirin nor his allergy pills  Tick bite months ago Bump with purple      Patient Active Problem List   Diagnosis Date Noted   Abnormal MRI of head 02/08/2021   Pituitary mass ([place]) 02/08/2021   Cerebral microvascular disease 02/08/2021   Nonintractable episodic headache 02/08/2021   Mixed hyperlipidemia 07/30/2019   Elevated hemoglobin (HCC) 07/30/2019   Allergic rhinitis 07/29/2019   Chronic obstructive pulmonary disease ([place]) 07/29/2019   Current smoker 06/26/2019   DOE (dyspnea on exertion) 06/26/2019      Current Outpatient Medications:    Albuterol Sulfate (PROAIR RESPICLICK)  993 (90 Base) MCG/ACT AEPB, Inhale 2 puffs into the lungs every 4 (four) hours as needed (wheeze, SOB, coughing fits)., Disp: 1 each, Rfl: 1   ASPIRIN LOW DOSE 81 MG tablet, TAKE 1 TABLET (81 MG TOTAL) BY MOUTH DAILY. SWALLOW WHOLE., Disp: 90 tablet, Rfl: 3   atorvastatin (LIPITOR) 20 MG tablet, Take 1 tablet (20 mg total) by mouth at bedtime., Disp: 90 tablet, Rfl: 3   budesonide-formoterol (SYMBICORT) 80-4.5 MCG/ACT inhaler, Inhale 2 puffs into the lungs 2 (two) times daily., Disp: 1 each, Rfl: 5   fluticasone (FLONASE) 50 MCG/ACT nasal spray, SPRAY 2 SPRAYS INTO EACH NOSTRIL EVERY DAY, Disp: 16 mL, Rfl: 6   levocetirizine (XYZAL) 5 MG tablet, TAKE 1 TABLET BY MOUTH EVERY DAY IN THE EVENING, Disp: 30 tablet, Rfl: 5   No Known Allergies   Social History   Tobacco Use   Smoking status: Every Day    Packs/day: 1.63    Years: 40.00    Total pack years: 65.20    Types: Cigarettes   Smokeless tobacco: Never  Vaping Use   Vaping Use: Never used  Substance Use Topics   Alcohol use: No    Alcohol/week: 0.0 standard drinks of alcohol    Comment: 165 months since last drink   Drug use: No    Comment: denies      Chart Review Today: I personally reviewed active problem list, medication list, allergies, family history, social history, health maintenance, notes from last encounter, lab results, imaging with the patient/caregiver today. Reviewed his specialists visits, imaging results, labs and notes for the past 1-2 years including but not limited to cardiology, neurology, PT, imaging of MRI brain, CT chest, pulmonology - more than 15 min of review of records from specialists since pt hasn't been in most of this year, but has seen multiple specialists at [facility] clinic- reviewed through care everywhere    Review of Systems  Constitutional: Negative.   HENT: Negative.    Eyes: Negative.   Respiratory: Negative.    Cardiovascular: Negative.   Gastrointestinal: Negative.   Endocrine:  Negative.   Genitourinary: Negative.   Musculoskeletal: Negative.   Skin: Negative.   Allergic/Immunologic: Negative.   Neurological: Negative.   Hematological: Negative.   Psychiatric/Behavioral: Negative.    All other systems reviewed and are negative.      Objective:   Vitals:   06/19/22 1437 06/19/22 1438  BP: 116/74   Pulse: (!) 122 (!) 102  Resp: 16   Temp: (!) 97.5 F (36.4 C)   TempSrc: Oral   SpO2: 98%   Weight: 177 lb 11.2 oz (80.6 kg)   Height: '5\' 6"'$  (1.676 m)     Body mass index is 28.68 kg/m.  Physical Exam Vitals and nursing note reviewed.  Constitutional:      General: He is not in acute distress.  Appearance: He is not ill-appearing, toxic-appearing or diaphoretic.  HENT:     Head: Normocephalic and atraumatic.     Right Ear: External ear normal.     Left Ear: External ear normal.  Eyes:     General: No scleral icterus.       Right eye: No discharge.        Left eye: No discharge.     Conjunctiva/sclera: Conjunctivae normal.  Cardiovascular:     Rate and Rhythm: Regular rhythm. Tachycardia present.     Pulses: Normal pulses.     Heart sounds: Normal heart sounds. No murmur heard.    No gallop.  Pulmonary:     Effort: No respiratory distress.     Breath sounds: No stridor. Wheezing present. No rhonchi or rales.  Chest:     Chest wall: No tenderness.  Abdominal:     General: Bowel sounds are normal.     Palpations: Abdomen is soft.  Musculoskeletal:     Cervical back: Normal range of motion.     Right lower leg: No edema.     Left lower leg: No edema.  Skin:    Coloration: Skin is not jaundiced or pale.  Neurological:     Mental Status: He is alert. Mental status is at baseline.     Motor: No weakness.  Psychiatric:        Mood and Affect: Mood normal.        Behavior: Behavior normal.      Results for orders placed or performed in visit on 01/13/21  CBC with Differential/Platelet  Result Value Ref Range   WBC 7.1 3.8 - 10.8  Thousand/uL   RBC 5.10 4.20 - 5.80 Million/uL   Hemoglobin 17.6 (H) 13.2 - 17.1 g/dL   HCT 49.8 38.5 - 50.0 %   MCV 97.6 80.0 - 100.0 fL   MCH 34.5 (H) 27.0 - 33.0 pg   MCHC 35.3 32.0 - 36.0 g/dL   RDW 13.1 11.0 - 15.0 %   Platelets 277 140 - 400 Thousand/uL   MPV 8.3 7.5 - 12.5 fL   Neutro Abs 3,394 1,500 - 7,800 cells/uL   Lymphs Abs 2,925 850 - 3,900 cells/uL   Absolute Monocytes 660 200 - 950 cells/uL   Eosinophils Absolute 92 15 - 500 cells/uL   Basophils Absolute 28 0 - 200 cells/uL   Neutrophils Relative % 47.8 %   Total Lymphocyte 41.2 %   Monocytes Relative 9.3 %   Eosinophils Relative 1.3 %   Basophils Relative 0.4 %  COMPLETE METABOLIC PANEL WITH GFR  Result Value Ref Range   Glucose, Bld 97 65 - 99 mg/dL   BUN 9 7 - 25 mg/dL   Creat 1.02 0.70 - 1.33 mg/dL   GFR, Est Non African American 83 > OR = 60 mL/min/1.42m   GFR, Est African American 96 > OR = 60 mL/min/1.761m  BUN/Creatinine Ratio NOT APPLICABLE 6 - 22 (calc)   Sodium 137 135 - 146 mmol/L   Potassium 4.8 3.5 - 5.3 mmol/L   Chloride 101 98 - 110 mmol/L   CO2 30 20 - 32 mmol/L   Calcium 9.8 8.6 - 10.3 mg/dL   Total Protein 7.1 6.1 - 8.1 g/dL   Albumin 4.4 3.6 - 5.1 g/dL   Globulin 2.7 1.9 - 3.7 g/dL (calc)   AG Ratio 1.6 1.0 - 2.5 (calc)   Total Bilirubin 0.4 0.2 - 1.2 mg/dL   Alkaline phosphatase (APISO) 73 35 - 144  U/L   AST 21 10 - 35 U/L   ALT 23 9 - 46 U/L  Lipid panel  Result Value Ref Range   Cholesterol 148 <200 mg/dL   HDL 44 > OR = 40 mg/dL   Triglycerides 99 <150 mg/dL   LDL Cholesterol (Calc) 85 mg/dL (calc)   Total CHOL/HDL Ratio 3.4 <5.0 (calc)   Non-HDL Cholesterol (Calc) 104 <130 mg/dL (calc)       Assessment & Plan:     ICD-10-CM   1. Benign paroxysmal positional vertigo, unspecified laterality  H81.10 Ambulatory referral to Neurology   Very reproducible vertigo episodes similar to past vertigo go, trial of meclizine and follow-up with vestibular PT    2. Tachycardia  R00.0  EKG 12-Lead    CBC with Differential/Platelet    Hemoglobin A1c    TSH    Comprehensive metabolic panel   Sinus tachycardia on EKG today history of heart rate 90-120 prior work up and consult with cardiology advised him to decrease tea/smoking    3. Mixed hyperlipidemia  E78.2 atorvastatin (LIPITOR) 20 MG tablet    Lipid panel    Comprehensive metabolic panel   he hasn't been taking medications, restart statin and ASA    4. Chronic bronchitis with productive mucopurulent cough (HCC)  J41.1 CBC with Differential/Platelet   see above    5. Current smoker  F17.200 Ambulatory Referral Lung Cancer Screening [facility]   advised smoking cessation     6. Seizure-like activity ([place])  R56.9 Ambulatory referral to Neurology   Some possible shaking with his right arm that he noted after waking from loss of consciousness about a month ago    7. Syncope, unspecified syncope type  R55    Unwitnessed syncopal episode unknown downtime, prior consult with cardiology and neurology    8. 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 Pulmonary emphysema, unspecified emphysema type ([place])  J43.9 Ambulatory Referral Lung Cancer Screening [facility]    SYMBICORT 160-4.5 MCG/ACT inhaler   Off inhalers, endorsing shortness of breath with activity, due for CT lung cancer screening follow-up    10. Pituitary adenoma ([place])  D35.2 Ambulatory referral to Neurology   Repeated imaging was normal he was due for 31-monthfollow-up scans to recheck and may need to follow-up with neuro    11. Elevated hemoglobin (HCC) Chronic D58.2 CBC with Differential/Platelet   Chronic likely from smoking    12. Unspecified abnormalities of gait and mobility  R26.9 Ambulatory referral to Neurology   follow up neuro    13. Abnormal MRI of head   R93.0 Ambulatory referral to Neurology   f/up neuro    14. Abnormal chest CT  R93.89 Ambulatory Referral Lung Cancer Screening [facility]   need f/up 6 month imaging - will route to pulm/lung nodule team, I am not sure if I can find the correct scan they noted    15. Cerebral microvascular disease  I67.89    off statin and asa, refilled    16. Tobacco use  Z72.0 Ambulatory Referral Lung Cancer Screening [facility]    17. Smokes more than 2 packs a day with greater than 20 pack year history  F17.210 Ambulatory Referral Lung Cancer Screening [place]    18. Rhinitis, unspecified type  J31.0  levocetirizine (XYZAL) 5 MG tablet   Edema erythema and purulent discharge treat with over-the-counter medications Xyzal and Flonase may reduce some morning coughing     Pt has been lost to f/up due to loss of insurance for majority of this year He is off all meds Noticeably more wheezy and some coughing intermittently Dr. Manuella Ghazi did refer him to cardiology and pulmonology at [facility] clinic and all those records were reviewed today and pt will need to get referred back to everyone with his new insurance.  Plan to get basic labs checkec (need for anxiolytics for that) Restart all meds and do 3 month f/up in office  His ECG was sinus tachycardia - he is exceptionally hyperactive, also continues to smoke and drinks about a gallon of tea a day- plan to restart inhalers, cut back on smoking, encouraged him to make sure that he takes a drink of water in between his drinks of tea throughout the day to hopefully get more fluids and hydration without caffeine or sugar  Some of his history given today regarding what happened at neurology does not seem to match the medical documentation which I reviewed so I will refer him back to neurology to follow-up   Return for 2-3 months routine f/up.  He reported to me and discussed his tick bite and states that he had an engorged tick on him on his  scrotum he removed it and he had some redness and swelling there for a short period of time which then resolved with only a small persistent more purple-colored bump, this was more than 6 months ago and he denies any episodes of illnesses with bodyaches fever sore throat headache or any rash or arthralgias (not really concerned for prior tick born illness w/o any sx)   Delsa Grana, PA-C 06/19/22 2:52 PM

## 2022-07-21 ENCOUNTER — Encounter: Payer: Medicaid Other | Admitting: Family Medicine

## 2022-07-25 DIAGNOSIS — R55 Syncope and collapse: Secondary | ICD-10-CM | POA: Diagnosis not present

## 2022-07-25 DIAGNOSIS — Z72821 Inadequate sleep hygiene: Secondary | ICD-10-CM | POA: Diagnosis not present

## 2022-07-25 DIAGNOSIS — R9082 White matter disease, unspecified: Secondary | ICD-10-CM | POA: Diagnosis not present

## 2022-07-25 DIAGNOSIS — R42 Dizziness and giddiness: Secondary | ICD-10-CM | POA: Diagnosis not present

## 2022-07-25 DIAGNOSIS — R569 Unspecified convulsions: Secondary | ICD-10-CM | POA: Diagnosis not present

## 2022-07-26 ENCOUNTER — Telehealth: Payer: Self-pay

## 2022-07-26 NOTE — Telephone Encounter (Signed)
Called to reschedule follow up LDCT for nodule. Patient had been scheduled but lost insurance according to notes.  There was no answer on phone call today and patient's voicemail is full and unable to leave message.  Letter to call for scheduling was sent through mychart.

## 2022-08-07 ENCOUNTER — Ambulatory Visit
Admission: RE | Admit: 2022-08-07 | Discharge: 2022-08-07 | Disposition: A | Discharge status: Home / Self Care | Payer: Medicaid Other | Source: Ambulatory Visit | Attending: Acute Care | Admitting: Acute Care

## 2022-08-07 DIAGNOSIS — F17210 Nicotine dependence, cigarettes, uncomplicated: Secondary | ICD-10-CM | POA: Insufficient documentation

## 2022-08-07 DIAGNOSIS — Z122 Encounter for screening for malignant neoplasm of respiratory organs: Secondary | ICD-10-CM | POA: Diagnosis not present

## 2022-08-07 DIAGNOSIS — Z87891 Personal history of nicotine dependence: Secondary | ICD-10-CM | POA: Diagnosis not present

## 2022-08-07 DIAGNOSIS — R911 Solitary pulmonary nodule: Secondary | ICD-10-CM | POA: Insufficient documentation

## 2022-08-09 ENCOUNTER — Telehealth: Payer: Self-pay | Admitting: Acute Care

## 2022-08-09 DIAGNOSIS — F17210 Nicotine dependence, cigarettes, uncomplicated: Secondary | ICD-10-CM

## 2022-08-09 DIAGNOSIS — Z87891 Personal history of nicotine dependence: Secondary | ICD-10-CM

## 2022-08-09 NOTE — Telephone Encounter (Signed)
I have called the patient with the results of his low dose CT Chest. This was a 6 month follow up.The nodules of concern in the previous scan are stable.There is again concern about ILD.When I spoke with the patient I offered him an appointment here in [place] to see an ILD specialist.  He prefferred to see someone in [place]. He has been seen in the past by Dr. Lanney Gins, so I will refer him back to him for follow up of the ILD findings.  Langley Gauss, 12 month follow up, and fax results to PCP with plans to refer to Dr. Lanney Gins at [place].

## 2022-08-10 ENCOUNTER — Telehealth: Payer: Self-pay | Admitting: Acute Care

## 2022-08-10 ENCOUNTER — Institutional Professional Consult (permissible substitution): Payer: Medicaid Other | Admitting: Pulmonary Disease

## 2022-08-10 NOTE — Telephone Encounter (Signed)
I have messaged Dr. Lanney Gins. He has agreed to get the patient scheduled in his office as follow up for the new ILD findings.

## 2022-08-10 NOTE — Telephone Encounter (Signed)
Annual 2025 LDCT order placed. Results/plan faxed to PCP

## 2022-08-15 DIAGNOSIS — R55 Syncope and collapse: Secondary | ICD-10-CM | POA: Diagnosis not present

## 2022-08-15 DIAGNOSIS — E538 Deficiency of other specified B group vitamins: Secondary | ICD-10-CM | POA: Diagnosis not present

## 2022-08-21 NOTE — Patient Instructions (Incomplete)

## 2022-08-22 ENCOUNTER — Encounter: Payer: Self-pay | Admitting: Family Medicine

## 2022-08-22 ENCOUNTER — Ambulatory Visit: Payer: Medicaid Other | Admitting: Family Medicine

## 2022-08-22 VITALS — BP 116/76 | HR 113 | Temp 98.4°F | Resp 16 | Ht 66.0 in | Wt 176.1 lb

## 2022-08-22 DIAGNOSIS — R000 Tachycardia, unspecified: Secondary | ICD-10-CM

## 2022-08-22 DIAGNOSIS — I2510 Atherosclerotic heart disease of native coronary artery without angina pectoris: Secondary | ICD-10-CM

## 2022-08-22 DIAGNOSIS — F17210 Nicotine dependence, cigarettes, uncomplicated: Secondary | ICD-10-CM

## 2022-08-22 DIAGNOSIS — Z0000 Encounter for general adult medical examination without abnormal findings: Secondary | ICD-10-CM | POA: Diagnosis not present

## 2022-08-22 DIAGNOSIS — F17200 Nicotine dependence, unspecified, uncomplicated: Secondary | ICD-10-CM

## 2022-08-22 DIAGNOSIS — E782 Mixed hyperlipidemia: Secondary | ICD-10-CM | POA: Diagnosis not present

## 2022-08-22 DIAGNOSIS — J439 Emphysema, unspecified: Secondary | ICD-10-CM

## 2022-08-22 DIAGNOSIS — D582 Other hemoglobinopathies: Secondary | ICD-10-CM | POA: Diagnosis not present

## 2022-08-22 DIAGNOSIS — Z125 Encounter for screening for malignant neoplasm of prostate: Secondary | ICD-10-CM | POA: Diagnosis not present

## 2022-08-22 DIAGNOSIS — I700 Atherosclerosis of aorta: Secondary | ICD-10-CM | POA: Diagnosis not present

## 2022-08-22 DIAGNOSIS — J849 Interstitial pulmonary disease, unspecified: Secondary | ICD-10-CM
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
## 2022-09-03 DIAGNOSIS — R569 Unspecified convulsions: Secondary | ICD-10-CM | POA: Diagnosis not present

## 2022-09-18 ENCOUNTER — Ambulatory Visit: Payer: Medicaid Other | Admitting: Family Medicine

## 2022-10-02 DIAGNOSIS — R0602 Shortness of breath: Secondary | ICD-10-CM | POA: Diagnosis not present

## 2022-10-02 DIAGNOSIS — J449 Chronic obstructive pulmonary disease, unspecified: Secondary | ICD-10-CM | POA: Diagnosis not present

## 2022-10-02 DIAGNOSIS — F17218 Nicotine dependence, cigarettes, with other nicotine-induced disorders: Secondary | ICD-10-CM | POA: Diagnosis not present

## 2022-10-04 DIAGNOSIS — J449 Chronic obstructive pulmonary disease, unspecified: Secondary | ICD-10-CM | POA: Diagnosis not present

## 2022-11-05 ENCOUNTER — Encounter: Payer: Self-pay | Admitting: Family Medicine

## 2022-11-06 DIAGNOSIS — J449 Chronic obstructive pulmonary disease, unspecified: Secondary | ICD-10-CM | POA: Diagnosis not present

## 2022-11-06 DIAGNOSIS — R0602 Shortness of breath: Secondary | ICD-10-CM | POA: Diagnosis not present

## 2022-11-08 ENCOUNTER — Telehealth: Payer: Self-pay
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
## 2022-11-09 ENCOUNTER — Encounter: Payer: Self-pay | Admitting: Family Medicine

## 2022-11-15 DIAGNOSIS — R55 Syncope and collapse: Secondary | ICD-10-CM | POA: Diagnosis not present

## 2022-11-23 DIAGNOSIS — F17200 Nicotine dependence, unspecified, uncomplicated: Secondary | ICD-10-CM | POA: Diagnosis not present

## 2022-11-23 DIAGNOSIS — E782 Mixed hyperlipidemia: Secondary | ICD-10-CM | POA: Diagnosis not present

## 2022-11-23 DIAGNOSIS — I4711 Inappropriate sinus tachycardia, so stated: Secondary | ICD-10-CM | POA: Diagnosis not present

## 2022-11-23 DIAGNOSIS — R0602 Shortness of breath: Secondary | ICD-10-CM | POA: Diagnosis not present

## 2022-11-23 DIAGNOSIS — J410 Simple chronic bronchitis: Secondary | ICD-10-CM | POA: Diagnosis not present

## 2023-01-31 NOTE — Progress Notes (Deleted)
   There were no vitals taken for this visit.   Subjective:    Patient ID: Kirk Martin, male    DOB: 17-Jun-1966, 57 y.o.   MRN: [id number]  HPI: Kirk Martin is a 57 y.o. male  No chief complaint on file.   Relevant past medical, surgical, family and social history reviewed and updated as indicated. Interim medical history since our last visit reviewed. Allergies and medications reviewed and updated.  Review of Systems  Per HPI unless specifically indicated above     Objective:    There were no vitals taken for this visit.  Wt Readings from Last 3 Encounters:  08/22/22 176 lb 1.6 oz (79.9 kg)  06/19/22 177 lb 11.2 oz (80.6 kg)  11/23/21 188 lb (85.3 kg)    Physical Exam  Results for orders placed or performed in visit on 01/13/21  CBC with Differential/Platelet  Result Value Ref Range   WBC 7.1 3.8 - 10.8 Thousand/uL   RBC 5.10 4.20 - 5.80 Million/uL   Hemoglobin 17.6 (H) 13.2 - 17.1 g/dL   HCT 52.8 41.3 - 24.4 %   MCV 97.6 80.0 - 100.0 fL   MCH 34.5 (H) 27.0 - 33.0 pg   MCHC 35.3 32.0 - 36.0 g/dL   RDW 01.0 27.2 - 53.6 %   Platelets 277 140 - 400 Thousand/uL   MPV 8.3 7.5 - 12.5 fL   Neutro Abs 3,394 1,500 - 7,800 cells/uL   Lymphs Abs 2,925 850 - 3,900 cells/uL   Absolute Monocytes 660 200 - 950 cells/uL   Eosinophils Absolute 92 15 - 500 cells/uL   Basophils Absolute 28 0 - 200 cells/uL   Neutrophils Relative % 47.8 %   Total Lymphocyte 41.2 %   Monocytes Relative 9.3 %   Eosinophils Relative 1.3 %   Basophils Relative 0.4 %  COMPLETE METABOLIC PANEL WITH GFR  Result Value Ref Range   Glucose, Bld 97 65 - 99 mg/dL   BUN 9 7 - 25 mg/dL   Creat 6.44 0.34 - 7.42 mg/dL   GFR, Est Non African American 83 > OR = 60 mL/min/1.37m2   GFR, Est African American 96 > OR = 60 mL/min/1.67m2   BUN/Creatinine Ratio NOT APPLICABLE 6 - 22 (calc)   Sodium 137 135 - 146 mmol/L   Potassium 4.8 3.5 - 5.3 mmol/L   Chloride 101 98 - 110 mmol/L   CO2 30 20 - 32  mmol/L   Calcium 9.8 8.6 - 10.3 mg/dL   Total Protein 7.1 6.1 - 8.1 g/dL   Albumin 4.4 3.6 - 5.1 g/dL   Globulin 2.7 1.9 - 3.7 g/dL (calc)   AG Ratio 1.6 1.0 - 2.5 (calc)   Total Bilirubin 0.4 0.2 - 1.2 mg/dL   Alkaline phosphatase (APISO) 73 35 - 144 U/L   AST 21 10 - 35 U/L   ALT 23 9 - 46 U/L  Lipid panel  Result Value Ref Range   Cholesterol 148 <200 mg/dL   HDL 44 > OR = 40 mg/dL   Triglycerides 99 <595 mg/dL   LDL Cholesterol (Calc) 85 mg/dL (calc)   Total CHOL/HDL Ratio 3.4 <5.0 (calc)   Non-HDL Cholesterol (Calc) 104 <130 mg/dL (calc)      Assessment & Plan:   Problem List Items Addressed This Visit   None    Follow up plan: No follow-ups on file.

## 2023-02-01 ENCOUNTER — Encounter: Payer: Self-pay | Admitting: Family Medicine

## 2023-02-01 ENCOUNTER — Ambulatory Visit (INDEPENDENT_AMBULATORY_CARE_PROVIDER_SITE_OTHER): Payer: Medicaid Other | Admitting: Family Medicine

## 2023-02-01 ENCOUNTER — Ambulatory Visit: Payer: Medicaid Other | Admitting: Family Medicine

## 2023-02-01 VITALS — BP 118/76 | HR 91 | Temp 98.0°F | Resp 18 | Ht 66.0 in | Wt 162.6 lb

## 2023-02-01 DIAGNOSIS — M546 Pain in thoracic spine: Secondary | ICD-10-CM

## 2023-02-01 DIAGNOSIS — R053 Chronic cough: Secondary | ICD-10-CM

## 2023-02-01 DIAGNOSIS — R9389 Abnormal findings on diagnostic imaging of other specified body structures: Secondary | ICD-10-CM

## 2023-02-01 MED ORDER — LIDOCAINE HCL (PF) 1 % IJ SOLN
2.0000 mL | Freq: Once | INTRAMUSCULAR | Status: AC
  Administered 2023-02-01: 2 mL via INTRADERMAL

## 2023-02-01 MED ORDER — BACLOFEN 10 MG PO TABS: 10.0000 mg | ORAL_TABLET | Freq: Three times a day (TID) | ORAL | 0 refills | Status: DC

## 2023-02-01 MED ORDER — TRIAMCINOLONE ACETONIDE 40 MG/ML IJ SUSP
40.0000 mg | Freq: Once | INTRAMUSCULAR | Status: AC
  Administered 2023-02-01: 40 mg via INTRAMUSCULAR
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
## 2023-02-01 NOTE — Progress Notes (Deleted)
Patient ID: Kirk Martin, male    DOB: 1966/06/09, 57 y.o.   MRN: [id number]  PCP: Danelle Berry, PA-C  No chief complaint on file.   Subjective:   Kirk Martin is a 57 y.o. male, presents to clinic with CC of the following:  HPI  Back pain and breathing issues Hx of ILD, significant smoking hx, COPD He has been on maintenance inhalers in the past, and a few years ago he was consistently wheezy ad that did improve with addition of inhalers   Patient Active Problem List   Diagnosis Date Noted   Aortic atherosclerosis (HCC) 08/22/2022   ILD (interstitial lung disease) (HCC) 08/22/2022   Coronary artery disease involving native heart without angina pectoris 08/22/2022   Abnormal MRI of head 02/08/2021   Cerebral microvascular disease 02/08/2021   Nonintractable episodic headache 02/08/2021   Mixed hyperlipidemia 07/30/2019   Elevated hemoglobin (HCC) 07/30/2019   Allergic rhinitis 07/29/2019   Chronic obstructive pulmonary disease (HCC) 07/29/2019   Current smoker 06/26/2019   DOE (dyspnea on exertion) 06/26/2019      Current Outpatient Medications:    Albuterol Sulfate (PROAIR RESPICLICK) 108 (90 Base) MCG/ACT AEPB, Inhale 2 puffs into the lungs every 4 (four) hours as needed (wheeze, SOB, coughing fits)., Disp: 1 each, Rfl: 1   ASPIRIN LOW DOSE 81 MG tablet, TAKE 1 TABLET (81 MG TOTAL) BY MOUTH DAILY. SWALLOW WHOLE., Disp: 90 tablet, Rfl: 3   atorvastatin (LIPITOR) 20 MG tablet, Take 1 tablet (20 mg total) by mouth at bedtime., Disp: 90 tablet, Rfl: 3   fluticasone (FLONASE) 50 MCG/ACT nasal spray, SPRAY 2 SPRAYS INTO EACH NOSTRIL EVERY DAY, Disp: 16 mL, Rfl: 6   levocetirizine (XYZAL) 5 MG tablet, Take 1 tablet (5 mg total) by mouth at bedtime as needed for allergies (nasal congestion/postnasal drip/coughing)., Disp: 90 tablet, Rfl: 3   LORazepam (ATIVAN) 0.5 MG tablet, Take 1-2 tablets (0.5-1 mg total) by mouth once as needed for up to 2 doses for sedation  or anxiety (severe phobia with labs or imaging)., Disp: 2 tablet, Rfl: 1   meclizine (ANTIVERT) 25 MG tablet, Take 0.5-1 tablets (12.5-25 mg total) by mouth 3 (three) times daily as needed for dizziness., Disp: 30 tablet, Rfl: 2   SYMBICORT 160-4.5 MCG/ACT inhaler, Inhale 2 puffs into the lungs in the morning and at bedtime. And rinse out mouth after each use (switch and spit), Disp: 3 each, Rfl: 3   No Known Allergies   Social History   Tobacco Use   Smoking status: Every Day    Current packs/day: 0.25    Average packs/day: 0.3 packs/day for 40.0 years (10.0 ttl pk-yrs)    Types: Cigarettes   Smokeless tobacco: Never   Tobacco comments:    More than 40 years of 1.5-2 ppd, but last ~2 years only smoking 0.25 ppd  Vaping Use   Vaping status: Never Used  Substance Use Topics   Alcohol use: No    Comment: 225 months since last drink   Drug use: No    Comment: denies      Chart Review Today: ***  Review of Systems     Objective:   There were no vitals filed for this visit.  There is no height or weight on file to calculate BMI.  Physical Exam   Results for orders placed or performed in visit on 01/13/21  CBC with Differential/Platelet  Result Value Ref Range   WBC 7.1 3.8 - 10.8 Thousand/uL  RBC 5.10 4.20 - 5.80 Million/uL   Hemoglobin 17.6 (H) 13.2 - 17.1 g/dL   HCT 16.1 09.6 - 04.5 %   MCV 97.6 80.0 - 100.0 fL   MCH 34.5 (H) 27.0 - 33.0 pg   MCHC 35.3 32.0 - 36.0 g/dL   RDW 40.9 81.1 - 91.4 %   Platelets 277 140 - 400 Thousand/uL   MPV 8.3 7.5 - 12.5 fL   Neutro Abs 3,394 1,500 - 7,800 cells/uL   Lymphs Abs 2,925 850 - 3,900 cells/uL   Absolute Monocytes 660 200 - 950 cells/uL   Eosinophils Absolute 92 15 - 500 cells/uL   Basophils Absolute 28 0 - 200 cells/uL   Neutrophils Relative % 47.8 %   Total Lymphocyte 41.2 %   Monocytes Relative 9.3 %   Eosinophils Relative 1.3 %   Basophils Relative 0.4 %  COMPLETE METABOLIC PANEL WITH GFR  Result Value Ref  Range   Glucose, Bld 97 65 - 99 mg/dL   BUN 9 7 - 25 mg/dL   Creat 7.82 9.56 - 2.13 mg/dL   GFR, Est Non African American 83 > OR = 60 mL/min/1.64m2   GFR, Est African American 96 > OR = 60 mL/min/1.24m2   BUN/Creatinine Ratio NOT APPLICABLE 6 - 22 (calc)   Sodium 137 135 - 146 mmol/L   Potassium 4.8 3.5 - 5.3 mmol/L   Chloride 101 98 - 110 mmol/L   CO2 30 20 - 32 mmol/L   Calcium 9.8 8.6 - 10.3 mg/dL   Total Protein 7.1 6.1 - 8.1 g/dL   Albumin 4.4 3.6 - 5.1 g/dL   Globulin 2.7 1.9 - 3.7 g/dL (calc)   AG Ratio 1.6 1.0 - 2.5 (calc)   Total Bilirubin 0.4 0.2 - 1.2 mg/dL   Alkaline phosphatase (APISO) 73 35 - 144 U/L   AST 21 10 - 35 U/L   ALT 23 9 - 46 U/L  Lipid panel  Result Value Ref Range   Cholesterol 148 <200 mg/dL   HDL 44 > OR = 40 mg/dL   Triglycerides 99 <086 mg/dL   LDL Cholesterol (Calc) 85 mg/dL (calc)   Total CHOL/HDL Ratio 3.4 <5.0 (calc)   Non-HDL Cholesterol (Calc) 104 <130 mg/dL (calc)       Assessment & Plan:   ***     Danelle Berry, PA-C 02/01/23 10:30 AM

## 2023-06-20 NOTE — Progress Notes (Unsigned)
There were no vitals taken for this visit.   Subjective:    Patient ID: Kirk Martin, male    DOB: [birth year], 57 y.o.   MRN: [id number]  HPI: Kirk Martin is a 57 y.o. male  No chief complaint on file.   Discussed the use of AI scribe software for clinical note transcription with the patient, who gave verbal consent to proceed.  History of Present Illness           02/01/2023    1:41 PM 08/22/2022    2:35 PM 06/19/2022    2:33 PM  Depression screen PHQ 2/9  Decreased Interest 0 0 0  Down, Depressed, Hopeless 0 0 0  PHQ - 2 Score 0 0 0  Altered sleeping  0 0  Tired, decreased energy  0 0  Change in appetite  0 0  Feeling bad or failure about yourself   0 0  Trouble concentrating  0 0  Moving slowly or fidgety/restless  0 0  Suicidal thoughts  0 0  PHQ-9 Score  0 0  Difficult doing work/chores  Not difficult at all Not difficult at all    Relevant past medical, surgical, family and social history reviewed and updated as indicated. Interim medical history since our last visit reviewed. Allergies and medications reviewed and updated.  Review of Systems  Per HPI unless specifically indicated above     Objective:    There were no vitals taken for this visit.  {Vitals History (Optional):23777} Wt Readings from Last 3 Encounters:  02/01/23 162 lb 9.6 oz (73.8 kg)  08/22/22 176 lb 1.6 oz (79.9 kg)  06/19/22 177 lb 11.2 oz (80.6 kg)    Physical Exam  Results for orders placed or performed in visit on 01/13/21  CBC with Differential/Platelet  Result Value Ref Range   WBC 7.1 3.8 - 10.8 Thousand/uL   RBC 5.10 4.20 - 5.80 Million/uL   Hemoglobin 17.6 (H) 13.2 - 17.1 g/dL   HCT 62.9 52.8 - 41.3 %   MCV 97.6 80.0 - 100.0 fL   MCH 34.5 (H) 27.0 - 33.0 pg   MCHC 35.3 32.0 - 36.0 g/dL   RDW 24.4 01.0 - 27.2 %   Platelets 277 140 - 400 Thousand/uL   MPV 8.3 7.5 - 12.5 fL   Neutro Abs 3,394 1,500 - 7,800 cells/uL   Lymphs Abs 2,925 850 - 3,900 cells/uL    Absolute Monocytes 660 200 - 950 cells/uL   Eosinophils Absolute 92 15 - 500 cells/uL   Basophils Absolute 28 0 - 200 cells/uL   Neutrophils Relative % 47.8 %   Total Lymphocyte 41.2 %   Monocytes Relative 9.3 %   Eosinophils Relative 1.3 %   Basophils Relative 0.4 %  COMPLETE METABOLIC PANEL WITH GFR  Result Value Ref Range   Glucose, Bld 97 65 - 99 mg/dL   BUN 9 7 - 25 mg/dL   Creat 5.36 6.44 - 0.34 mg/dL   GFR, Est Non African American 83 > OR = 60 mL/min/1.32m2   GFR, Est African American 96 > OR = 60 mL/min/1.64m2   BUN/Creatinine Ratio NOT APPLICABLE 6 - 22 (calc)   Sodium 137 135 - 146 mmol/L   Potassium 4.8 3.5 - 5.3 mmol/L   Chloride 101 98 - 110 mmol/L   CO2 30 20 - 32 mmol/L   Calcium 9.8 8.6 - 10.3 mg/dL   Total Protein 7.1 6.1 - 8.1 g/dL   Albumin 4.4 3.6 -  5.1 g/dL   Globulin 2.7 1.9 - 3.7 g/dL (calc)   AG Ratio 1.6 1.0 - 2.5 (calc)   Total Bilirubin 0.4 0.2 - 1.2 mg/dL   Alkaline phosphatase (APISO) 73 35 - 144 U/L   AST 21 10 - 35 U/L   ALT 23 9 - 46 U/L  Lipid panel  Result Value Ref Range   Cholesterol 148 <200 mg/dL   HDL 44 > OR = 40 mg/dL   Triglycerides 99 <960 mg/dL   LDL Cholesterol (Calc) 85 mg/dL (calc)   Total CHOL/HDL Ratio 3.4 <5.0 (calc)   Non-HDL Cholesterol (Calc) 104 <130 mg/dL (calc)   {Labs (AVWUJWJX):91478}    Assessment & Plan:   Problem List Items Addressed This Visit   None    Assessment and Plan             Follow up plan: No follow-ups on file.

## 2023-06-21 ENCOUNTER — Ambulatory Visit (INDEPENDENT_AMBULATORY_CARE_PROVIDER_SITE_OTHER): Payer: Medicaid Other | Admitting: Nurse Practitioner

## 2023-06-21 ENCOUNTER — Encounter: Payer: Self-pay | Admitting: Nurse Practitioner

## 2023-06-21 VITALS — BP 120/74 | HR 98 | Temp 97.7°F | Resp 18 | Ht 66.0 in | Wt 142.7 lb

## 2023-06-21 DIAGNOSIS — R300 Dysuria: Secondary | ICD-10-CM | POA: Diagnosis not present

## 2023-06-21 LAB — POCT URINALYSIS DIPSTICK
Glucose, UA: NEGATIVE
Ketones, UA: NEGATIVE
Protein, UA: POSITIVE — AB
Spec Grav, UA: 1.02 (ref 1.010–1.025)
Urobilinogen, UA: >=8 U/dL — AB
pH, UA: 6 (ref 5.0–8.0)

## 2023-06-21 MED ORDER — CEPHALEXIN 500 MG PO CAPS: 500.0000 mg | ORAL_CAPSULE | Freq: Two times a day (BID) | ORAL | 0 refills | Status: AC

## 2023-06-23 LAB — URINE CULTURE
MICRO NUMBER:: 15844561
SPECIMEN QUALITY:: ADEQUATE

## 2023-07-09 ENCOUNTER — Ambulatory Visit: Payer: Self-pay

## 2023-07-09 NOTE — Telephone Encounter (Signed)
Spoke with Annice Pih and informed her that Kirk Martin will need an appointment. She verbalized understanding. I offered her an appointment with Denny Peon for tomorrow morning and she will call back

## 2023-07-09 NOTE — Telephone Encounter (Signed)
  Chief Complaint: UTI  Symptoms: dysuria returned, got better but came back worse now  Frequency: 2 weeks or more  Pertinent Negatives: NA Disposition: [] ED /[] Urgent Care (no appt availability in office) / [] Appointment(In office/virtual)/ []  [facility]/ [] Home Care/ [] Refused Recommended Disposition /[] [facility]/ [x]  Follow-up with PCP Additional Notes: spoke with sister Annice Pih, she states pt took Keflex and got better but near finishing abx noticed sx return and now worse. Advised since pt was seen on 06/21/23 pt would need to be reseen. Offered to schedule OV but 1st available she didn't prefer since pt isn't morning person and didn't want to bring in unless absolutely had to. Preferred I send message back and see if abx can be sent in without him being seen and if sx don't improve then she will bring pt in.    Summary: Antibiotic refill request, symptoms have returned   Pt's sister called requesting a stronger antibiotic for the patient, he completed the first cycle and his symptoms have returned even stronger now.  [id number] - [place], [place] - [address] [place] [address] Phone: [phone] Fax: [phone]         Reason for Disposition  [1] Taking antibiotic > 72 hours (3 days) for UTI AND [2] painful urination or frequency is SAME (unchanged, not better)  Answer Assessment - Initial Assessment Questions 1. MAIN SYMPTOM: "What is the main symptom you are concerned about?" (e.g., painful urination, urine frequency)     Dysuria  2. BETTER-SAME-WORSE: "Are you getting better, staying the same, or getting worse compared to how you felt at your last visit to the doctor (most recent medical visit)?"     Better but worse now  3. PAIN: "How bad is the pain?"  (e.g., Scale 1-10; mild, moderate, or severe)   - MILD (1-3): complains slightly about urination hurting   - MODERATE (4-7): interferes with normal activities     - SEVERE (8-10):  excruciating, unwilling or unable to urinate because of the pain      Moderate  6. DIAGNOSIS: "When was the UTI diagnosed?" "By whom?" "Was it a kidney infection, bladder infection or both?"     UTI  7. ANTIBIOTIC: "What antibiotic(s) are you taking?" "How many times per day?"     Keflex BID x 5 days  8. ANTIBIOTIC - START DATE: "When did you start taking the antibiotic?"     06/21/23  Protocols used: Urinary Tract Infection on Antibiotic Follow-up Call - Male-A-AH

## 2023-07-10 ENCOUNTER — Ambulatory Visit: Payer: Medicaid Other | Admitting: Physician Assistant

## 2023-07-12 ENCOUNTER — Ambulatory Visit: Payer: Medicaid Other | Admitting: Physician Assistant

## 2023-07-12 ENCOUNTER — Encounter: Payer: Self-pay | Admitting: Physician Assistant

## 2023-07-12 VITALS — BP 108/66 | HR 106 | Resp 16 | Ht 66.0 in | Wt 142.8 lb

## 2023-07-12 DIAGNOSIS — N390 Urinary tract infection, site not specified: Secondary | ICD-10-CM | POA: Diagnosis not present

## 2023-07-12 DIAGNOSIS — R319 Hematuria, unspecified: Secondary | ICD-10-CM | POA: Diagnosis not present

## 2023-07-12 DIAGNOSIS — R300 Dysuria: Secondary | ICD-10-CM

## 2023-07-12 LAB — POCT URINALYSIS DIPSTICK
Bilirubin, UA: NEGATIVE
Glucose, UA: NEGATIVE
Ketones, UA: NEGATIVE
Protein, UA: NEGATIVE
Spec Grav, UA: 1.01 (ref 1.010–1.025)
Urobilinogen, UA: 0.2 U/dL
pH, UA: 7 (ref 5.0–8.0)

## 2023-07-12 MED ORDER — SULFAMETHOXAZOLE-TRIMETHOPRIM 800-160 MG PO TABS
1.0000 | ORAL_TABLET | Freq: Two times a day (BID) | ORAL | 0 refills | Status: AC
Start: 2023-07-12 — End: 2023-07-19

## 2023-07-12 NOTE — Progress Notes (Signed)
 Acute Office Visit   Patient: Kirk Martin   DOB: 03-Oct-1965   58 y.o. Male  MRN: [id number] Visit Date: 07/12/2023  Today's healthcare provider: Rocky BRAVO Fiza Nation, PA-C  Introduced myself to the patient as a PA-C and provided education on APPs in clinical practice.    Chief Complaint  Patient presents with   Dysuria    Onset for a month   Subjective    HPI HPI     Dysuria    Additional comments: Onset for a month      Last edited by Tressa Ogles, CMA on 07/12/2023  1:12 PM.      Discussed the use of AI scribe software for clinical note transcription with the patient, who gave verbal consent to proceed.  History of Present Illness   The patient presents with recurrent urinary symptoms, characterized by dysuria and urinary urgency, with only a few trickles of urine despite the sensation of a full bladder. They describe the urine as bright orange and heavier than usual, sinking rapidly to the bottom of the bowl. They deny any fever, chills, or hematuria. The patient had a similar episode recently, for which they were prescribed a five-day course of antibiotics. The symptoms resolved during the treatment but returned after the completion of the medication course.  In addition to urinary symptoms, the patient reports intermittent back pain, specifically between the shoulder blades, which necessitates sitting down for relief. They deny any lower back pain. The patient also mentions issues with focus and forgetfulness, which they believe may be related to a previously diagnosed brain tumor. They note that these cognitive symptoms and the urinary symptoms seem to occur concurrently.  The patient has a history of practicing safe sex and denies any known allergies. They have been managing their health conditions with the help of their sister, who also handles their medical appointments.       Medications: Outpatient Medications Prior to Visit  Medication Sig    baclofen  (LIORESAL ) 10 MG tablet Take 1 tablet (10 mg total) by mouth 3 (three) times daily.   SYMBICORT  160-4.5 MCG/ACT inhaler Inhale 2 puffs into the lungs in the morning and at bedtime. And rinse out mouth after each use (switch and spit)   Albuterol  Sulfate (PROAIR  RESPICLICK) 108 (90 Base) MCG/ACT AEPB Inhale 2 puffs into the lungs every 4 (four) hours as needed (wheeze, SOB, coughing fits). (Patient not taking: Reported on 02/01/2023)   ASPIRIN  LOW DOSE 81 MG tablet TAKE 1 TABLET (81 MG TOTAL) BY MOUTH DAILY. SWALLOW WHOLE. (Patient not taking: Reported on 02/01/2023)   atorvastatin  (LIPITOR) 20 MG tablet Take 1 tablet (20 mg total) by mouth at bedtime. (Patient not taking: Reported on 02/01/2023)   fluticasone  (FLONASE ) 50 MCG/ACT nasal spray SPRAY 2 SPRAYS INTO EACH NOSTRIL EVERY DAY (Patient not taking: Reported on 07/12/2023)   levocetirizine (XYZAL ) 5 MG tablet Take 1 tablet (5 mg total) by mouth at bedtime as needed for allergies (nasal congestion/postnasal drip/coughing). (Patient not taking: Reported on 02/01/2023)   meclizine  (ANTIVERT ) 25 MG tablet Take 0.5-1 tablets (12.5-25 mg total) by mouth 3 (three) times daily as needed for dizziness. (Patient not taking: Reported on 02/01/2023)   No facility-administered medications prior to visit.    Review of Systems  Constitutional:  Negative for chills and fever.  Genitourinary:  Positive for difficulty urinating, dysuria, frequency and urgency. Negative for enuresis, flank pain and hematuria.  He reports urinary hesitancy and bladder pressure    Musculoskeletal:  Positive for back pain (intermittent pain between shoulders).        Objective    BP 108/66   Pulse (!) 106   Resp 16   Ht 5' 6 (1.676 m)   Wt 142 lb 12.8 oz (64.8 kg)   SpO2 95%   BMI 23.05 kg/m     Physical Exam Vitals reviewed.  Constitutional:      General: He is awake.     Appearance: Normal appearance. He is well-developed and well-groomed.  HENT:      Head: Normocephalic and atraumatic.  Eyes:     Extraocular Movements: Extraocular movements intact.     Conjunctiva/sclera: Conjunctivae normal.  Pulmonary:     Effort: Pulmonary effort is normal.  Musculoskeletal:     Cervical back: Normal range of motion.  Neurological:     General: No focal deficit present.     Mental Status: He is alert and oriented to person, place, and time.     GCS: GCS eye subscore is 4. GCS verbal subscore is 5. GCS motor subscore is 6.  Psychiatric:        Attention and Perception: Attention normal.        Mood and Affect: Mood normal.        Speech: Speech normal.        Behavior: Behavior normal. Behavior is cooperative.       Results for orders placed or performed in visit on 07/12/23  POCT urinalysis dipstick  Result Value Ref Range   Color, UA Yellow    Clarity, UA Cloudy    Glucose, UA Negative Negative   Bilirubin, UA Negative    Ketones, UA Negative    Spec Grav, UA 1.010 1.010 - 1.025   Blood, UA Large    pH, UA 7.0 5.0 - 8.0   Protein, UA Negative Negative   Urobilinogen, UA 0.2 0.2 or 1.0 E.U./dL   Nitrite, UA Large    Leukocytes, UA Large (3+) (A) Negative   Appearance yellow    Odor foul     Assessment & Plan      No follow-ups on file.      Problem List Items Addressed This Visit   None Visit Diagnoses       Urinary tract infection with hematuria, site unspecified    -  Primary   Relevant Medications   sulfamethoxazole -trimethoprim  (BACTRIM  DS) 800-160 MG tablet     Dysuria       Relevant Orders   POCT urinalysis dipstick (Completed)   Urine Culture      Assessment and Plan    Urinary Tract Infection (UTI) Recurrent UTI with dysuria, urgency, and difficulty urinating. No fever, chills, or hematuria. Urine described as bright orange. Previous five-day antibiotic course was effective but symptoms recurred. No use of OTC medications like Azo. Intermittent upper back pain noted. Recurrence may require further  evaluation. Urine dip results are consistent with UTI- results reviewed with patient during apt. Will send for culture for ID and susceptibility testing  - Prescribe Bactrim , 1 tablet twice daily for 7 days - Send MyChart message if any changes are needed - Send antibiotic prescription to [facility] in [place]  Cognitive Issues Difficulty staying focused and forgetting speech. Symptoms may be related to recurrent UTI as confusion can be a symptom. Concern about previous brain tumor, reportedly healed. Further neurological evaluation if cognitive symptoms persist post-UTI treatment. - Monitor cognitive symptoms  and reassess if UTI treatment does not resolve confusion  Upper Back Pain Intermittent pain between shoulder blades, exacerbated by standing and relieved by sitting. No lower back pain. Further imaging or specialist referral if pain persists or worsens. - Monitor back pain and reassess if symptoms persist or worsen.        No follow-ups on file.   I, Karinne Schmader E Makarios Madlock, PA-C, have reviewed all documentation for this visit. The documentation on 07/13/23 for the exam, diagnosis, procedures, and orders are all accurate and complete.   [place]y [place], MHS, PA-C [facility]

## 2023-07-12 NOTE — Patient Instructions (Signed)
Based on your symptoms and results of the urinalysis I believe you have a UTI I recommend the following:   I have sent in a script for Bactrim to be taken by mouth twice per day for 7 days   Please finish the entire course of the antibiotic even if you are feeling better before it is completed.  Stay well hydrated (at least 75 oz of water per day) and avoid holding your urine If you have any of the following please let us know: persistent symptoms, fever, trouble urinating or inability to urinate, confusion, flank pain.

## 2023-07-14 LAB — URINE CULTURE
MICRO NUMBER:: 15911226
SPECIMEN QUALITY:: ADEQUATE

## 2023-07-16 NOTE — Progress Notes (Signed)
 Your urine culture results are back.  The culture results demonstrated that you had a urinary tract infection caused by a bacteria called E. coli.  This bacteria was found to be susceptible to the antibiotic that you were started on at your appointment.  Please make sure that you finish the entire course as directed.  Please let us  know if you have further questions or concerns

## 2023-08-09 ENCOUNTER — Ambulatory Visit: Admission: RE | Admit: 2023-08-09 | Payer: Medicaid Other | Source: Ambulatory Visit

## 2023-10-02 ENCOUNTER — Encounter: Payer: Self-pay | Admitting: Family Medicine

## 2023-11-01 ENCOUNTER — Other Ambulatory Visit: Payer: Self-pay

## 2023-11-01 ENCOUNTER — Ambulatory Visit: Admitting: Family Medicine

## 2023-11-01 ENCOUNTER — Encounter: Payer: Self-pay | Admitting: Family Medicine

## 2023-11-01 VITALS — BP 120/72 | HR 100 | Temp 97.9°F | Resp 18 | Ht 66.0 in | Wt 151.9 lb

## 2023-11-01 DIAGNOSIS — K4090 Unilateral inguinal hernia, without obstruction or gangrene, not specified as recurrent: Secondary | ICD-10-CM | POA: Diagnosis not present

## 2023-11-01 DIAGNOSIS — M79671 Pain in right foot: Secondary | ICD-10-CM | POA: Diagnosis not present

## 2023-11-01 DIAGNOSIS — M79601 Pain in right arm: Secondary | ICD-10-CM

## 2023-11-01 DIAGNOSIS — J439 Emphysema, unspecified: Secondary | ICD-10-CM

## 2023-11-01 DIAGNOSIS — M79641 Pain in right hand: Secondary | ICD-10-CM

## 2023-11-01 DIAGNOSIS — S91331A Puncture wound without foreign body, right foot, initial encounter: Secondary | ICD-10-CM

## 2023-11-01 DIAGNOSIS — R200 Anesthesia of skin: Secondary | ICD-10-CM

## 2023-11-01 MED ORDER — SYMBICORT 160-4.5 MCG/ACT IN AERO: 2.0000 | INHALATION_SPRAY | Freq: Two times a day (BID) | RESPIRATORY_TRACT | 3 refills | Status: AC
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
## 2023-11-01 NOTE — Patient Instructions (Signed)
 Groin Hernia (Inguinal Hernia) in Adults: What to Know  A hernia happens when an organ or tissue in your body pushes out through a weak spot in the muscles of your belly. This makes a bulge. A groin hernia is also called an inguinal hernia. It's found in your groin, which is the area where your leg meets your lower belly. This kind of hernia could also be: In your scrotum, if you're male. In the folds of skin around your vagina, if you're male. You may be able to push the bulge back into your belly. If you can't push it in and blood flow is cut off to the hernia, you'll need surgery right away. What are the causes? A groin hernia may happen when you strain your belly muscles, such as when you: Lift a heavy object. Strain to poop. Cough. What increases the risk? You may be more likely to get a groin hernia if: You're male. You're 50 years or older. You're pregnant. You've had a groin hernia or belly surgery before. You smoke. You're overweight. You work at a job where you need to stand a lot or lift heavy things. What are the signs or symptoms? Symptoms may depend on how big the hernia is. If it's small, you may not have symptoms. If it's bigger, you may have: A bulge near your groin or genitals. Pain or burning in your groin. A dull ache or feeling of pressure in your groin. If blood flow is cut off to the tissues inside the hernia, you may also: Feel pain and tenderness when you touch the bulge. The skin over it may turn red or purple. Have a fever. Throw up or feel like you may throw up. Have trouble pooping or passing gas. How is this treated? Treatment depends on how big the hernia is and what symptoms you have. You may need: To be watched to see if the bulge grows bigger. Surgery. This may be done if the hernia is big or if you have symptoms. Follow these instructions at home: Lifestyle Ask if it's OK for you to lift. Try not to stand for long periods of time. Do not  smoke, vape, or use nicotine or tobacco. Stay at a healthy weight. Try not to do things that put pressure on your hernia. Preventing trouble pooping You may need to take these steps to help prevent or treat trouble pooping (constipation): Take medicines to help you poop. Eat foods high in fiber, like beans, whole grains, and fresh fruits and vegetables. Drink more fluids as told. General instructions Try to push the hernia back in place by very gently pressing on it while lying down. Do not try to force it back in if it won't push in easily. Watch your hernia for any changes in: Shape. Size. Color. Take your medicines only as told. Contact a doctor if: You have a fever. You have new symptoms. Your symptoms get worse. You can't poop or pass gas. Get help right away if: Your bulge: Starts to hurt a lot. Changes color. You have sudden pain in your scrotum, or your scrotum changes size. You can't gently push the hernia back in place. You feel like you may vomit, and that feeling does not go away. You keep throwing up or feeling like you need to throw up. These symptoms may be an emergency. Call 911 right away. Do not wait to see if the symptoms will go away. Do not drive yourself to the hospital. This information is  not intended to replace advice given to you by your health care provider. Make sure you discuss any questions you have with your health care provider. Document Revised: 02/22/2023 Document Reviewed: 02/22/2023 Elsevier Patient Education  2024 [employer].
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
## 2023-12-04 ENCOUNTER — Ambulatory Visit: Admitting: Podiatry

## 2023-12-23 ENCOUNTER — Emergency Department
Admission: EM | Admit: 2023-12-23 | Discharge: 2023-12-23 | Disposition: A | Discharge status: Home / Self Care | Attending: Emergency Medicine | Admitting: Emergency Medicine

## 2023-12-23 ENCOUNTER — Emergency Department

## 2023-12-23 ENCOUNTER — Other Ambulatory Visit: Payer: Self-pay

## 2023-12-23 DIAGNOSIS — S51851A Open bite of right forearm, initial encounter: Secondary | ICD-10-CM | POA: Diagnosis present

## 2023-12-23 DIAGNOSIS — J449 Chronic obstructive pulmonary disease, unspecified: Secondary | ICD-10-CM | POA: Insufficient documentation

## 2023-12-23 DIAGNOSIS — W540XXA Bitten by dog, initial encounter: Secondary | ICD-10-CM | POA: Diagnosis not present

## 2023-12-23 DIAGNOSIS — I10 Essential (primary) hypertension: Secondary | ICD-10-CM | POA: Insufficient documentation

## 2023-12-23 MED ORDER — AMOXICILLIN-POT CLAVULANATE 875-125 MG PO TABS: 1.0000 | ORAL_TABLET | Freq: Two times a day (BID) | ORAL | 0 refills | Status: AC

## 2023-12-23 MED ORDER — OXYCODONE-ACETAMINOPHEN 5-325 MG PO TABS
1.0000 | ORAL_TABLET | Freq: Once | ORAL | Status: AC
  Administered 2023-12-23: 1 via ORAL
  Filled 2023-12-23: qty 1

## 2023-12-23 MED ORDER — AMOXICILLIN-POT CLAVULANATE 875-125 MG PO TABS
1.0000 | ORAL_TABLET | Freq: Once | ORAL | Status: AC
  Administered 2023-12-23: 1 via ORAL
  Filled 2023-12-23: qty 1

## 2023-12-23 MED ORDER — IBUPROFEN 600 MG PO TABS
600.0000 mg | ORAL_TABLET | Freq: Once | ORAL | Status: AC
  Administered 2023-12-23: 600 mg via ORAL
  Filled 2023-12-23: qty 1

## 2023-12-23 MED ORDER — OXYCODONE-ACETAMINOPHEN 5-325 MG PO TABS: 1.0000 | ORAL_TABLET | Freq: Four times a day (QID) | ORAL | 0 refills | Status: AC | PRN

## 2023-12-23 NOTE — ED Notes (Signed)
 Wound irrigated with 20 ml saline  Tolerated well

## 2023-12-23 NOTE — ED Provider Notes (Signed)
 [facility] Provider Note    Event Date/Time   First MD Initiated Contact with Patient 12/23/23 1243     (approximate)   History   Animal Bite   HPI  Kirk Martin is a 58 y.o. male with history of COPD, hypertension, not on anticoagulation, presenting with right forearm pain after dog bite.  Patient was breaking up a dog fight between his pitbull as well as his lab, he was bitten on his right forearm.  Per family, they washed out his wounds thoroughly before coming in to emergency department.  He is up-to-date with his tetanus, the dogs are vaccinated fully.  He denies any numbness or weakness to his right upper extremity.  Denies any bites anywhere else.   Independent history obtained from family as above.  On independent review, he was seen by his primary care doctor in April, does have history of puncture wound to the right foot, note states that Tdap is up-to-date.  Physical Exam   Triage Vital Signs: ED Triage Vitals  Encounter Vitals Group     BP 12/23/23 1212 132/85     Girls Systolic BP Percentile --      Girls Diastolic BP Percentile --      Boys Systolic BP Percentile --      Boys Diastolic BP Percentile --      Pulse Rate 12/23/23 1212 79     Resp 12/23/23 1212 18     Temp 12/23/23 1212 97.6 F (36.4 C)     Temp Source 12/23/23 1212 Oral     SpO2 12/23/23 1212 99 %     Weight 12/23/23 1211 146 lb (66.2 kg)     Height 12/23/23 1211 5' 6 (1.676 m)     Head Circumference --      Peak Flow --      Pain Score 12/23/23 1212 8     Pain Loc --      Pain Education --      Exclude from Growth Chart --     Most recent vital signs: Vitals:   12/23/23 1212  BP: 132/85  Pulse: 79  Resp: 18  Temp: 97.6 F (36.4 C)  SpO2: 99%     General: Awake, no distress.  CV:  Good peripheral perfusion.  Resp:  Normal effort.  Abd:  No distention.  Other:  Equal radial pulses bilaterally, grip strength is intact, sensation is intact, he  does have puncture wounds to the dorsal and ventral forearm distally and 1 2cm laceration dorsally and 1 2cm laceration ventrally, no active bleeding, able to fully range wrist. There is an areas of swelling to the lateral distal forearm   ED Results / Procedures / Treatments   Labs (all labs ordered are listed, but only abnormal results are displayed) Labs Reviewed - No data to display   RADIOLOGY On my independent interpretation, x-ray without obvious fractures   PROCEDURES:  Critical Care performed: No  Procedures   MEDICATIONS ORDERED IN ED: Medications  amoxicillin-clavulanate (AUGMENTIN) 875-125 MG per tablet 1 tablet (has no administration in time range)  oxyCODONE-acetaminophen (PERCOCET/ROXICET) 5-325 MG per tablet 1 tablet (1 tablet Oral Given 12/23/23 1304)  ibuprofen (ADVIL) tablet 600 mg (600 mg Oral Given 12/23/23 1304)     IMPRESSION / MDM / ASSESSMENT AND PLAN / ED COURSE  I reviewed the triage vital signs and the nursing notes.  Differential diagnosis includes, but is not limited to, animal bite, laceration, considered fracture, contusion, strain.  Will get x-rays of the forearm, his Tdap is up-to-date, will give him a Percocet and ibuprofen here, Augmentin.  Patient's presentation is most consistent with acute presentation with potential threat to life or bodily function.  Independent interpretation of imaging below.  Patient's wounds were irrigated extensively and dressed, left to heal with secondary intention.  Extensive discussion done with patient about wound care, he has an appointment with his primary care doctor on Wednesday, instructed him to follow-up to get his wounds checked.  Considered but no indication for inpatient admission at this time, he safe for outpatient management.  Will discharge with strict return precautions.    Clinical Course as of 12/23/23 1407  Sun Dec 23, 2023  1355 DG Forearm Right Soft tissue  swelling.  No fracture or foreign body.  [TT]    Clinical Course User Index [TT] Drenda Gentle, Richard Champion, MD     FINAL CLINICAL IMPRESSION(S) / ED DIAGNOSES   Final diagnoses:  Dog bite, initial encounter     Rx / DC Orders   ED Discharge Orders          Ordered    amoxicillin-clavulanate (AUGMENTIN) 875-125 MG tablet  2 times daily        12/23/23 1356    oxyCODONE-acetaminophen (PERCOCET) 5-325 MG tablet  Every 6 hours PRN        12/23/23 1358             Note:  This document was prepared using Dragon voice recognition software and may include unintentional dictation errors.    Shane Darling, MD 12/23/23 [phone]

## 2023-12-23 NOTE — ED Notes (Signed)
 See triage note  Presents with animal bite   States this was his dogs  Puncture wounds noted

## 2023-12-23 NOTE — ED Triage Notes (Signed)
 Patient states he broke up a dog fight between his two dogs; has puncture wounds to right forearm.

## 2023-12-23 NOTE — Discharge Instructions (Addendum)
 Please go see your primary care doctor in 2 to 3 days to get your wounds reassessed.  Please take the antibiotics as prescribed for your dog bite.  You can take Tylenol ibuprofen as needed for the pain.  Please reserve the Percocet for severe breakthrough pain, please not drive or operate heavy machinery when you are on the Percocet.  Please make sure that you are not taking more than 4 g of Tylenol in a 24-hour period.  Please make sure to keep the bite sites clean and dry for 24 to 48 hours.  Until the wounds have fully healed, do not soak your arm, do not go into pools, lakes, any other body of water.  If you start to have any redness, purulent drainage, swelling, fevers, numbness, weakness to the arm, please return to the emergency department to be seen.

## 2023-12-25 ENCOUNTER — Ambulatory Visit: Admitting: Family Medicine

## 2023-12-25 ENCOUNTER — Encounter: Payer: Self-pay | Admitting: Family Medicine

## 2023-12-25 VITALS — BP 128/76 | HR 93 | Resp 16 | Ht 66.0 in | Wt 146.0 lb

## 2023-12-25 DIAGNOSIS — L03113 Cellulitis of right upper limb: Secondary | ICD-10-CM

## 2023-12-25 DIAGNOSIS — W540XXD Bitten by dog, subsequent encounter: Secondary | ICD-10-CM

## 2023-12-25 DIAGNOSIS — I700 Atherosclerosis of aorta: Secondary | ICD-10-CM

## 2023-12-25 DIAGNOSIS — Z5181 Encounter for therapeutic drug level monitoring: Secondary | ICD-10-CM

## 2023-12-25 DIAGNOSIS — I6789 Other cerebrovascular disease: Secondary | ICD-10-CM

## 2023-12-25 DIAGNOSIS — J310 Chronic rhinitis: Secondary | ICD-10-CM

## 2023-12-25 DIAGNOSIS — Z125 Encounter for screening for malignant neoplasm of prostate: Secondary | ICD-10-CM

## 2023-12-25 DIAGNOSIS — S41151A Open bite of right upper arm, initial encounter: Secondary | ICD-10-CM | POA: Diagnosis not present

## 2023-12-25 DIAGNOSIS — Z5189 Encounter for other specified aftercare: Secondary | ICD-10-CM

## 2023-12-25 DIAGNOSIS — F17200 Nicotine dependence, unspecified, uncomplicated: Secondary | ICD-10-CM

## 2023-12-25 DIAGNOSIS — E782 Mixed hyperlipidemia: Secondary | ICD-10-CM

## 2023-12-25 DIAGNOSIS — F0781 Postconcussional syndrome: Secondary | ICD-10-CM

## 2023-12-25 DIAGNOSIS — J302 Other seasonal allergic rhinitis: Secondary | ICD-10-CM

## 2023-12-25 DIAGNOSIS — S51851A Open bite of right forearm, initial encounter: Secondary | ICD-10-CM

## 2023-12-25 DIAGNOSIS — W540XXA Bitten by dog, initial encounter: Secondary | ICD-10-CM

## 2023-12-25 DIAGNOSIS — R413 Other amnesia: Secondary | ICD-10-CM

## 2023-12-25 DIAGNOSIS — J439 Emphysema, unspecified: Secondary | ICD-10-CM

## 2023-12-25 DIAGNOSIS — R4189 Other symptoms and signs involving cognitive functions and awareness: Secondary | ICD-10-CM

## 2023-12-25 MED ORDER — LEVOCETIRIZINE DIHYDROCHLORIDE 5 MG PO TABS: 5.0000 mg | ORAL_TABLET | Freq: Every evening | ORAL | 3 refills | Status: AC | PRN

## 2023-12-25 NOTE — Assessment & Plan Note (Signed)
 Intermittent atorvastatin  compliance Needs labs and then meds will be refills or adjusted Lab Results  Component Value Date   CHOL 148 01/13/2021   HDL 44 01/13/2021   LDLCALC 85 01/13/2021   TRIG 99 01/13/2021   CHOLHDL 3.4 01/13/2021
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
## 2023-12-25 NOTE — Assessment & Plan Note (Signed)
 Pt is still smoking, continues to only use symbicort  when he feels like he can't breath thankfully no recent exacerbations Explained to pt again symbicort  is usually maintenance and daily use and for acute sx he can use albuterol 

## 2024-01-15 ENCOUNTER — Other Ambulatory Visit: Payer: Self-pay

## 2024-01-15 ENCOUNTER — Telehealth: Payer: Self-pay

## 2024-01-15 DIAGNOSIS — F0781 Postconcussional syndrome: Secondary | ICD-10-CM

## 2024-01-15 DIAGNOSIS — R4189 Other symptoms and signs involving cognitive functions and awareness: Secondary | ICD-10-CM

## 2024-01-15 DIAGNOSIS — R413 Other amnesia: Secondary | ICD-10-CM

## 2024-01-15 NOTE — Telephone Encounter (Signed)
 Copied from CRM [id number]. Topic: Referral - Question >> Jan 15, 2024  2:06 PM Emylou G wrote: Reason for CRM: Sister called.. confirmed they don't like the referral they were given.. need a different person.  He had a bad experience.  Can they tried [facility] [phone]

## 2024-01-22 ENCOUNTER — Other Ambulatory Visit: Payer: Self-pay

## 2024-01-22 DIAGNOSIS — F0781 Postconcussional syndrome: Secondary | ICD-10-CM

## 2024-01-22 DIAGNOSIS — R4189 Other symptoms and signs involving cognitive functions and awareness: Secondary | ICD-10-CM

## 2024-01-22 DIAGNOSIS — R413 Other amnesia: Secondary | ICD-10-CM

## 2024-01-31 ENCOUNTER — Telehealth: Payer: Self-pay | Admitting: Family Medicine

## 2024-01-31 NOTE — Telephone Encounter (Signed)
 Returned call and reviewed all the referrals w/sister. Notified we did the 3rd referral to [facility] as they denied previous Neurologist in [place] and [facility] denied the referral. Sister will reach out to [facility] to get scheduled.

## 2024-01-31 NOTE — Telephone Encounter (Unsigned)
 Copied from CRM #[id number]. Topic: Referral - Question >> Jan 31, 2024 11:59 AM Delon DASEN wrote: Reason for CRM: Per sister Lonell [facility] needs more information for referral to be accepted, would prefer something close to home within the [facility] system if possible. He is having more cognitive issues. Needs to see neuro asap. Please call [phone]

## 2024-02-03 ENCOUNTER — Other Ambulatory Visit: Payer: Self-pay | Admitting: Family Medicine

## 2024-02-05 NOTE — Telephone Encounter (Signed)
 Pt has not gotten lab work done yet.  Sent pt message to come by office and complete.  Atorvastatin  not on active med list.   Requested Prescriptions  Pending Prescriptions Disp Refills   atorvastatin  (LIPITOR) 20 MG tablet [Pharmacy Med Name: ATORVASTATIN  20 MG TABLET] 90 tablet 3    Sig: TAKE 1 TABLET BY MOUTH EVERYDAY AT BEDTIME     Cardiovascular:  Antilipid - Statins Failed - 02/05/2024 11:15 AM      Failed - Lipid Panel in normal range within the last 12 months    Cholesterol  Date Value Ref Range Status  01/13/2021 148 <200 mg/dL Final   LDL Cholesterol (Calc)  Date Value Ref Range Status  01/13/2021 85 mg/dL (calc) Final    Comment:    Reference range: <100 . Desirable range <100 mg/dL for primary prevention;   <70 mg/dL for patients with CHD or diabetic patients  with > or = 2 CHD risk factors. SABRA LDL-C is now calculated using the Martin-Hopkins  calculation, which is a validated novel method providing  better accuracy than the Friedewald equation in the  estimation of LDL-C.  Gladis APPLETHWAITE et al. SANDREA. 7986;689(80): 2061-2068  (http://education.[facility].com/faq/FAQ164)    HDL  Date Value Ref Range Status  01/13/2021 44 > OR = 40 mg/dL Final   Triglycerides  Date Value Ref Range Status  01/13/2021 99 <150 mg/dL Final         Passed - Patient is not pregnant      Passed - Valid encounter within last 12 months    Recent Outpatient Visits           1 month ago Dog bite of arm, right, initial encounter   [facility] Leavy Mole, PA-C   3 months ago Puncture wound of right foot, initial encounter   [facility] Leavy Mole, PA-C

## 2024-04-11 ENCOUNTER — Telehealth: Payer: Self-pay | Admitting: Family Medicine

## 2024-04-11 NOTE — Telephone Encounter (Unsigned)
 Copied from CRM #[id number]. Topic: Referral - Question >> Apr 11, 2024 12:57 PM Donee H wrote: Reason for CRM: Patient's sister Lonell Fireman called requesting to speak directly to Michelene Cower nurse regarding referral for patient. She did not state what kind of referral but stated she has been working with nurse on a referral. Please follow back up with Lonell at [phone]

## 2024-05-12 ENCOUNTER — Encounter: Payer: Self-pay | Admitting: Radiology
